# Patient Record
Sex: Male | Born: 1990 | Race: Black or African American | Hispanic: No | Marital: Single | Smoking: Current every day smoker
Health system: Southern US, Community
[De-identification: ages and names within clinical notes are randomized; demographics above are authoritative.]

## PROBLEM LIST (undated history)

## (undated) DIAGNOSIS — J309 Allergic rhinitis, unspecified: Secondary | ICD-10-CM

## (undated) DIAGNOSIS — H6690 Otitis media, unspecified, unspecified ear: Secondary | ICD-10-CM

## (undated) DIAGNOSIS — F419 Anxiety disorder, unspecified: Secondary | ICD-10-CM

## (undated) DIAGNOSIS — S8990XA Unspecified injury of unspecified lower leg, initial encounter: Secondary | ICD-10-CM

## (undated) DIAGNOSIS — L0591 Pilonidal cyst without abscess: Secondary | ICD-10-CM

## (undated) HISTORY — PX: KNEE SURGERY: SHX244

## (undated) HISTORY — DX: Allergic rhinitis, unspecified: J30.9

## (undated) HISTORY — DX: Unspecified injury of unspecified lower leg, initial encounter: S89.90XA

## (undated) HISTORY — DX: Pilonidal cyst without abscess: L05.91

## (undated) HISTORY — PX: WRIST SURGERY: SHX841

## (undated) HISTORY — DX: Otitis media, unspecified, unspecified ear: H66.90

---
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
## 2011-10-16 ENCOUNTER — Emergency Department: Payer: Self-pay | Admitting: Emergency Medicine

## 2012-01-08 ENCOUNTER — Emergency Department: Payer: Self-pay | Admitting: Emergency Medicine

## 2012-02-28 ENCOUNTER — Ambulatory Visit: Payer: Self-pay | Admitting: Family Medicine

## 2013-03-28 ENCOUNTER — Emergency Department: Payer: Self-pay | Admitting: Emergency Medicine

## 2013-08-05 ENCOUNTER — Encounter: Payer: Self-pay | Admitting: *Deleted

## 2013-08-12 ENCOUNTER — Ambulatory Visit: Payer: Self-pay | Admitting: Cardiovascular Disease

## 2013-08-12 ENCOUNTER — Encounter: Payer: Self-pay | Admitting: *Deleted

## 2015-06-23 ENCOUNTER — Emergency Department
Admission: EM | Admit: 2015-06-23 | Discharge: 2015-06-23 | Disposition: A | Discharge status: Home / Self Care | Payer: Self-pay | Attending: Emergency Medicine | Admitting: Emergency Medicine

## 2015-06-23 DIAGNOSIS — H6982 Other specified disorders of Eustachian tube, left ear: Secondary | ICD-10-CM

## 2015-06-23 DIAGNOSIS — J029 Acute pharyngitis, unspecified: Secondary | ICD-10-CM | POA: Insufficient documentation

## 2015-06-23 DIAGNOSIS — Z720 Tobacco use: Secondary | ICD-10-CM | POA: Insufficient documentation

## 2015-06-23 DIAGNOSIS — Z79899 Other long term (current) drug therapy: Secondary | ICD-10-CM | POA: Insufficient documentation

## 2015-06-23 DIAGNOSIS — H6992 Unspecified Eustachian tube disorder, left ear: Secondary | ICD-10-CM | POA: Insufficient documentation

## 2015-06-23 MED ORDER — PREDNISONE 10 MG PO TABS: ORAL_TABLET | ORAL | Status: DC

## 2015-06-23 NOTE — ED Notes (Signed)
NAD noted at time of D/C. Pt denies questions or concerns. Pt ambulatory to the lobby at this time.  

## 2015-06-23 NOTE — ED Notes (Signed)
Pt reports to ED w/ pain in throat and ear.  Pt reports that pain started in ear and has moved down to throat.

## 2015-06-23 NOTE — ED Provider Notes (Signed)
[facility] Emergency Department Provider Note  ____________________________________________  Time seen:  8:37 AM  I have reviewed the triage vital signs and the nursing notes.   HISTORY  Chief Complaint Otalgia and Sore Throat   HPI Tyler Horton is a 24 y.o. male is here with complaint of left ear pain radiating down into his throat. He is unaware of any fever or chills. He states he has had this before and it was "fluid behind his ear". He is able to eat and drink. Pain is constant. He is not taking any over-the-counter medication for his ear. He is unaware of any hearing changes. He denies any dizziness or vertigo. He denies any upper respiratory symptoms. Currently pain is 10 out of 10.   Past Medical History  Diagnosis Date  . Knee injury     left  . Otitis media     left  . Allergic rhinitis   . Pilonidal cyst without infection     There are no active problems to display for this patient.   Past Surgical History  Procedure Laterality Date  . Wrist surgery    . Knee surgery      Current Outpatient Rx  Name  Route  Sig  Dispense  Refill  . buPROPion (WELLBUTRIN SR) 150 MG 12 hr tablet   Oral   Take 150 mg by mouth daily.         . citalopram (CELEXA) 20 MG tablet   Oral   Take 20 mg by mouth daily.         . predniSONE (DELTASONE) 10 MG tablet      Take 3 tablets once a day for 3 days   9 tablet   0     Allergies Review of patient's allergies indicates no known allergies.  No family history on file.  Social History History  Substance Use Topics  . Smoking status: Current Every Day Smoker -- 0.50 packs/day    Types: Cigarettes  . Smokeless tobacco: Not on file  . Alcohol Use: 7.2 oz/week    12 Cans of beer per week    Review of Systems Constitutional: No fever/chills Eyes: No visual changes. ENT: Positive sore throat. Cardiovascular: Denies chest pain. Respiratory: Denies shortness of  breath. Gastrointestinal: No abdominal pain.  No nausea, no vomiting. Genitourinary: Negative for dysuria. Musculoskeletal: Negative for back pain. Skin: Negative for rash. Neurological: Negative for headaches, focal weakness or numbness.  10-point ROS otherwise negative.  ____________________________________________   PHYSICAL EXAM:  VITAL SIGNS: ED Triage Vitals  Enc Vitals Group     BP 06/23/15 0812 112/80 mmHg     Pulse Rate 06/23/15 0809 81     Resp 06/23/15 0809 14     Temp 06/23/15 0809 98.4 F (36.9 C)     Temp Source 06/23/15 0809 Oral     SpO2 06/23/15 0809 99 %     Weight 06/23/15 0809 147 lb (66.679 kg)     Height 06/23/15 0809  (1.778 m)     Head Cir --      Peak Flow --      Pain Score 06/23/15 0810 8     Pain Loc --      Pain Edu? --      Excl. in GC? --     Constitutional: Alert and oriented. Well appearing and in no acute distress. Eyes: Conjunctivae are normal. PERRL. EOMI. Head: Atraumatic. Nose: No congestion/rhinnorhea.  TMs are dull bilaterally with  the left TM poor light reflection. No erythema Mouth/Throat: Mucous membranes are moist.  Oropharynx non-erythematous. Neck: No stridor.  Supple Hematological/Lymphatic/Immunilogical: No cervical lymphadenopathy. Cardiovascular: Normal rate, regular rhythm. Grossly normal heart sounds.  Good peripheral circulation. Respiratory: Normal respiratory effort.  No retractions. Lungs CTAB. Gastrointestinal: Soft and nontender. No distention. Musculoskeletal: No lower extremity tenderness nor edema.  No joint effusions. Neurologic:  Normal speech and language. No gross focal neurologic deficits are appreciated. No gait instability. Skin:  Skin is warm, dry and intact. No rash noted. Psychiatric: Mood and affect are normal. Speech and behavior are normal.  ____________________________________________   LABS (all labs ordered are listed, but only abnormal results are displayed)  Labs Reviewed - No  data to display  PROCEDURES  Procedure(s) performed: None  Critical Care performed: No  ____________________________________________   INITIAL IMPRESSION / ASSESSMENT AND PLAN / ED COURSE  Pertinent labs & imaging results that were available during my care of the patient were reviewed by me and considered in my medical decision making (see chart for details).  Patient was started on prednisone for eustachian tube dysfunction. He is follow-up with Dr. Willeen Cass if any continued problems with his ear. ____________________________________________   FINAL CLINICAL IMPRESSION(S) / ED DIAGNOSES  Final diagnoses:  Eustachian tube dysfunction, left      Tommi Rumps, PA-C 06/23/15 1191  Arnaldo Natal, MD 06/23/15 1723

## 2016-04-10 ENCOUNTER — Emergency Department
Admission: EM | Admit: 2016-04-10 | Discharge: 2016-04-10 | Disposition: A | Discharge status: Home / Self Care | Payer: Self-pay | Attending: Emergency Medicine | Admitting: Emergency Medicine

## 2016-04-10 ENCOUNTER — Encounter: Payer: Self-pay | Admitting: Emergency Medicine

## 2016-04-10 ENCOUNTER — Emergency Department: Payer: Self-pay

## 2016-04-10 DIAGNOSIS — Y929 Unspecified place or not applicable: Secondary | ICD-10-CM | POA: Insufficient documentation

## 2016-04-10 DIAGNOSIS — F17210 Nicotine dependence, cigarettes, uncomplicated: Secondary | ICD-10-CM | POA: Insufficient documentation

## 2016-04-10 DIAGNOSIS — X500XXA Overexertion from strenuous movement or load, initial encounter: Secondary | ICD-10-CM | POA: Insufficient documentation

## 2016-04-10 DIAGNOSIS — Y9389 Activity, other specified: Secondary | ICD-10-CM | POA: Insufficient documentation

## 2016-04-10 DIAGNOSIS — S29011A Strain of muscle and tendon of front wall of thorax, initial encounter: Secondary | ICD-10-CM | POA: Insufficient documentation

## 2016-04-10 DIAGNOSIS — Y999 Unspecified external cause status: Secondary | ICD-10-CM | POA: Insufficient documentation

## 2016-04-10 MED ORDER — BACLOFEN 10 MG PO TABS: 10.0000 mg | ORAL_TABLET | Freq: Three times a day (TID) | ORAL | Status: DC

## 2016-04-10 MED ORDER — IBUPROFEN 800 MG PO TABS: 800.0000 mg | ORAL_TABLET | Freq: Three times a day (TID) | ORAL | Status: DC | PRN

## 2016-04-10 NOTE — ED Notes (Signed)
Pt presents with chest bone pain started yesterday, feels muscular in type. Pt lifts heavy boxes at work.

## 2016-04-10 NOTE — Discharge Instructions (Signed)

## 2016-04-10 NOTE — ED Provider Notes (Signed)
[facility] Emergency Department Provider Note  ____________________________________________  Time seen: Approximately 2:56 PM  I have reviewed the triage vital signs and the nursing notes.   HISTORY  Chief Complaint Muscle Pain    HPI Tyler Horton is a 25 y.o. male who presents for evaluation of chest wall pain. Patient states that his sternal bone hurts. States that he thinks may be due to lifting boxes at work but unsure. Denies any shortness of breath. Denies any nausea vomiting or diaphoresis.   Past Medical History  Diagnosis Date  . Knee injury     left  . Otitis media     left  . Allergic rhinitis   . Pilonidal cyst without infection     There are no active problems to display for this patient.   Past Surgical History  Procedure Laterality Date  . Wrist surgery    . Knee surgery      Current Outpatient Rx  Name  Route  Sig  Dispense  Refill  . baclofen (LIORESAL) 10 MG tablet   Oral   Take 1 tablet (10 mg total) by mouth 3 (three) times daily.   30 tablet   0   . ibuprofen (ADVIL,MOTRIN) 800 MG tablet   Oral   Take 1 tablet (800 mg total) by mouth every 8 (eight) hours as needed.   30 tablet   0     Allergies Review of patient's allergies indicates no known allergies.  No family history on file.  Social History Social History  Substance Use Topics  . Smoking status: Current Every Day Smoker -- 0.50 packs/day    Types: Cigarettes  . Smokeless tobacco: None  . Alcohol Use: 7.2 oz/week    12 Cans of beer per week    Review of Systems Constitutional: No fever/chills Eyes: No visual changes. ENT: No sore throat. Cardiovascular:Positive for chest wall pain. Respiratory: Denies shortness of breath. Gastrointestinal: No abdominal pain.  No nausea, no vomiting.  No diarrhea.  No constipation. Musculoskeletal:Positive for chest wall pain. Skin: Negative for rash. Neurological: Negative for headaches, focal weakness  or numbness.  10-point ROS otherwise negative.  ____________________________________________   PHYSICAL EXAM:  VITAL SIGNS: ED Triage Vitals  Enc Vitals Group     BP 04/10/16 1349 107/55 mmHg     Pulse Rate 04/10/16 1349 82     Resp 04/10/16 1349 18     Temp 04/10/16 1349 98.2 F (36.8 C)     Temp Source 04/10/16 1349 Oral     SpO2 04/10/16 1349 100 %     Weight 04/10/16 1349 151 lb (68.493 kg)     Height 04/10/16 1349  (1.753 m)     Head Cir --      Peak Flow --      Pain Score 04/10/16 1349 7     Pain Loc --      Pain Edu? --      Excl. in GC? --     Constitutional: Alert and oriented. Well appearing and in no acute distress.  Cardiovascular: Normal rate, regular rhythm. Grossly normal heart sounds.  Good peripheral circulation. Respiratory: Normal respiratory effort.  No retractions. Lungs CTAB. Gastrointestinal: Soft and nontender. No distention. No abdominal bruits. No CVA tenderness. Musculoskeletal: Point tenderness to the chest wall midsternal. Neurologic:  Normal speech and language. No gross focal neurologic deficits are appreciated. No gait instability. Skin:  Skin is warm, dry and intact. No rash noted. Psychiatric: Mood and  affect are normal. Speech and behavior are normal.  ____________________________________________   LABS (all labs ordered are listed, but only abnormal results are displayed)  Labs Reviewed - No data to display ____________________________________________  EKG  No acute STEMI. Normal sinus rhythm. ____________________________________________  RADIOLOGY  No acute cardiopulmonary findings. No acute osseous findings. ____________________________________________   PROCEDURES  Procedure(s) performed: None  Critical Care performed: No  ____________________________________________   INITIAL IMPRESSION / ASSESSMENT AND PLAN / ED COURSE  Pertinent labs & imaging results that were available during my care of the patient  were reviewed by me and considered in my medical decision making (see chart for details).  Nonspecific chest wall pain. Rx given for baclofen 10 mg 3 times a day and Motrin 800 mg 3 times a day. Work excuse 24 hours given. Patient to follow-up with PCP or return to ER with any worsening symptomology. ____________________________________________   FINAL CLINICAL IMPRESSION(S) / ED DIAGNOSES  Final diagnoses:  Chest wall muscle strain, initial encounter     This chart was dictated using voice recognition software/Dragon. Despite best efforts to proofread, errors can occur which can change the meaning. Any change was purely unintentional.   Evangeline Dakinharles M Kathrynne Kulinski, PA-C 04/10/16 1535  Governor Rooksebecca Lord, MD 04/10/16 [phone]

## 2016-06-21 ENCOUNTER — Emergency Department
Admission: EM | Admit: 2016-06-21 | Discharge: 2016-06-21 | Disposition: A | Discharge status: Home / Self Care | Payer: Self-pay | Attending: Student in an Organized Health Care Education/Training Program | Admitting: Student in an Organized Health Care Education/Training Program

## 2016-06-21 ENCOUNTER — Encounter: Payer: Self-pay | Admitting: *Deleted

## 2016-06-21 DIAGNOSIS — J039 Acute tonsillitis, unspecified: Secondary | ICD-10-CM | POA: Insufficient documentation

## 2016-06-21 DIAGNOSIS — F17210 Nicotine dependence, cigarettes, uncomplicated: Secondary | ICD-10-CM | POA: Insufficient documentation

## 2016-06-21 LAB — POCT RAPID STREP A: STREPTOCOCCUS, GROUP A SCREEN (DIRECT): NEGATIVE

## 2016-06-21 MED ORDER — DEXAMETHASONE 2 MG PO TABS: ORAL_TABLET | ORAL | 0 refills | Status: DC

## 2016-06-21 MED ORDER — FLUCONAZOLE 100 MG PO TABS: 100.0000 mg | ORAL_TABLET | Freq: Every day | ORAL | 0 refills | Status: DC

## 2016-06-21 MED ORDER — ACETAMINOPHEN 325 MG PO TABS
650.0000 mg | ORAL_TABLET | Freq: Once | ORAL | Status: AC
  Administered 2016-06-21: 650 mg via ORAL
  Filled 2016-06-21: qty 2

## 2016-06-21 MED ORDER — MAGIC MOUTHWASH W/LIDOCAINE: 5.0000 mL | Freq: Four times a day (QID) | ORAL | 0 refills | Status: DC | PRN

## 2016-06-21 MED ORDER — AMOXICILLIN 875 MG PO TABS: 875.0000 mg | ORAL_TABLET | Freq: Two times a day (BID) | ORAL | 0 refills | Status: DC

## 2016-06-21 NOTE — ED Notes (Signed)
Sore throat since Monday  Positive fever  Increased pain with swallowing

## 2016-06-21 NOTE — ED Triage Notes (Signed)
Pt arrives with complaints of a sore throat since Monday

## 2016-06-21 NOTE — ED Provider Notes (Signed)
[facility] Emergency Department Provider Note  ____________________________________________  Time seen: Approximately 12:58 PM  I have reviewed the triage vital signs and the nursing notes.   HISTORY  Chief Complaint Sore Throat    HPI Tyler Horton is a 25 y.o. male , NAD, presents to the emergency department with 2 day history of sore throat, fevers and body aches. Patient states sore throat began on Monday and has been consistently worsening. Has felt like he's run a fever but has not checked his temperature and has had body aches with fatigue. Has been able to eat and drink but with pain with swallowing. Has not had any difficulty breathing. Denies chest pain, shortness of breath. Has not had any nasal congestion, sinus pressure, ear pain. Denies abdominal pain, nausea, vomiting nor rashes. Denies any sick contacts. Has not been taking anything over-the-counter for his current symptoms.   Past Medical History:  Diagnosis Date  . Allergic rhinitis   . Knee injury    left  . Otitis media    left  . Pilonidal cyst without infection     There are no active problems to display for this patient.   Past Surgical History:  Procedure Laterality Date  . KNEE SURGERY    . WRIST SURGERY      Prior to Admission medications   Medication Sig Start Date End Date Taking? Authorizing Provider  amoxicillin (AMOXIL) 875 MG tablet Take 1 tablet (875 mg total) by mouth 2 (two) times daily. 06/21/16   Abbigale Mcelhaney L Lucynda Rosano, PA-C  dexamethasone (DECADRON) 2 MG tablet Take 6 tablets on Day 1 with food, then decrease by 1 tablet daily until finished (6,5,4,3,2,1) 06/21/16   Azzure Garabedian L Kriya Westra, PA-C  fluconazole (DIFLUCAN) 100 MG tablet Take 1 tablet (100 mg total) by mouth daily. Take 1 tablet by mouth today and another in 3 days 06/21/16   Celestia Duva L Ania Levay, PA-C  magic mouthwash w/lidocaine SOLN Take 5 mLs by mouth 4 (four) times daily as needed for mouth pain. 06/21/16   Shade Rivenbark L Cullen Lahaie, PA-C     Allergies Review of patient's allergies indicates no known allergies.  History reviewed. No pertinent family history.  Social History Social History  Substance Use Topics  . Smoking status: Current Every Day Smoker    Packs/day: 0.50    Types: Cigarettes  . Smokeless tobacco: Not on file  . Alcohol use 7.2 oz/week    12 Cans of beer per week     Review of Systems  Constitutional: Positive fever, chills, fatigue Eyes: No visual changes. No discharge, redness, pain ENT: Positive sore throat. No nasal congestion, runny nose, sinus pressure, ear pain. Cardiovascular: No chest pain. Respiratory: No cough. No shortness of breath. No wheezing.  Gastrointestinal: No abdominal pain.  No nausea, vomiting.  No diarrhea.  Musculoskeletal: Positive for general myalgias.  Skin: Negative for rash. Neurological: Negative for headaches, focal weakness or numbness. No tingling. 10-point ROS otherwise negative.  ____________________________________________   PHYSICAL EXAM:  VITAL SIGNS: ED Triage Vitals [06/21/16 1245]  Enc Vitals Group     BP 106/68     Pulse Rate (!) 108     Resp 18     Temp (!) 101.1 F (38.4 C)     Temp Source Oral     SpO2 98 %     Weight 159 lb (72.1 kg)     Height  (1.778 m)     Head Circumference      Peak  Flow      Pain Score 9     Pain Loc      Pain Edu?      Excl. in GC?      Constitutional: Alert and oriented. Well appearing and in no acute distress. Eyes: Conjunctivae are normal.  Head: Atraumatic. ENT:      Ears: TMs visualized bilaterally without erythema, effusion, bulging nor perforation.      Nose: No congestion/rhinnorhea.      Mouth/Throat: Bilateral tonsils with moderate swelling and injection with white exudate. Uvula is midline but swollen and covered with white exudate. Upper palate with white exudate and underlying erythema. No posterior pharyngeal swelling. Patient is able to swallow secretions. Mucous membranes are  moist.  Neck: No stridor. Supple with full range of motion. Trachea is midline. Hematological/Lymphatic/Immunilogical: Positive left, anterior, focal cervical lymphadenopathy with mild tenderness to palpation but is mobile. Cardiovascular: Normal rate, regular rhythm. Normal S1 and S2.  Good peripheral circulation. Respiratory: Normal respiratory effort without tachypnea or retractions. Lungs CTAB with breath sounds noted in all lung fields. Neurologic:  Normal speech and language. No gross focal neurologic deficits are appreciated.  Skin:  Skin is warm, dry and intact. No rash noted. Psychiatric: Mood and affect are normal. Speech and behavior are normal. Patient exhibits appropriate insight and judgement.   ____________________________________________   LABS (all labs ordered are listed, but only abnormal results are displayed)  Labs Reviewed  CULTURE, GROUP A STREP [facility])  POCT RAPID STREP A    ----------------------------------------- 1:41 PM on 06/21/2016 -----------------------------------------  I called and spoke with a representative in the [facility] lab to states that if he group A strep culture grows yeast that we will be notified of that result. No separate throat culture will need to be done to assess for yeast. ____________________________________________  EKG  None ____________________________________________  RADIOLOGY  None ____________________________________________    PROCEDURES  Procedure(s) performed: None   Procedures   Medications  acetaminophen (TYLENOL) tablet 650 mg (650 mg Oral Given 06/21/16 1325)     ____________________________________________   INITIAL IMPRESSION / ASSESSMENT AND PLAN / ED COURSE  Pertinent labs & imaging results that were available during my care of the patient were reviewed by me and considered in my medical decision making (see chart for details).  Clinical Course    Patient's  diagnosis is consistent with Acute tonsillitis. Patient will be discharged home with prescriptions for amoxicillin, Decadron, Diflucan and Magic mouthwash with lidocaine use as directed. Patient will be notified of for culture results when available. Patient is to follow up with his primary care provider in 48 hours for recheck. Patient is given strict ED precautions to return to the ED for any worsening or new symptoms.      ____________________________________________  FINAL CLINICAL IMPRESSION(S) / ED DIAGNOSES  Final diagnoses:  Tonsillitis      NEW MEDICATIONS STARTED DURING THIS VISIT:  Discharge Medication List as of 06/21/2016  1:50 PM    START taking these medications   Details  amoxicillin (AMOXIL) 875 MG tablet Take 1 tablet (875 mg total) by mouth 2 (two) times daily., Starting Wed 06/21/2016, Print    dexamethasone (DECADRON) 2 MG tablet Take 6 tablets on Day 1 with food, then decrease by 1 tablet daily until finished (6,5,4,3,2,1), Print    fluconazole (DIFLUCAN) 100 MG tablet Take 1 tablet (100 mg total) by mouth daily. Take 1 tablet by mouth today and another in 3 days, Starting Wed 06/21/2016,  Print    magic mouthwash w/lidocaine SOLN Take 5 mLs by mouth 4 (four) times daily as needed for mouth pain., Starting Wed 06/21/2016, Print             Ernestene Kiel [place], PA-C 06/21/16 1433    Willy Eddy, MD 06/21/16 1440

## 2016-06-24 LAB — CULTURE, GROUP A STREP (THRC)

## 2016-08-07 ENCOUNTER — Emergency Department
Admission: EM | Admit: 2016-08-07 | Discharge: 2016-08-07 | Disposition: A | Discharge status: Home / Self Care | Payer: Self-pay | Attending: Emergency Medicine | Admitting: Emergency Medicine

## 2016-08-07 ENCOUNTER — Encounter: Payer: Self-pay | Admitting: Emergency Medicine

## 2016-08-07 DIAGNOSIS — F17210 Nicotine dependence, cigarettes, uncomplicated: Secondary | ICD-10-CM | POA: Insufficient documentation

## 2016-08-07 DIAGNOSIS — J039 Acute tonsillitis, unspecified: Secondary | ICD-10-CM | POA: Insufficient documentation

## 2016-08-07 MED ORDER — SULFAMETHOXAZOLE-TRIMETHOPRIM 800-160 MG PO TABS: 1.0000 | ORAL_TABLET | Freq: Two times a day (BID) | ORAL | 0 refills | Status: DC

## 2016-08-07 MED ORDER — LIDOCAINE VISCOUS 2 % MT SOLN: 20.0000 mL | OROMUCOSAL | 0 refills | Status: DC | PRN

## 2016-08-07 NOTE — Discharge Instructions (Signed)
You need to follow-up with ENT for your chronic recurrent tonsillitis.

## 2016-08-07 NOTE — ED Triage Notes (Signed)
Pt to ed with c/o sore throat and earache x 2 days.

## 2016-08-07 NOTE — ED Provider Notes (Signed)
[facility] Emergency Department Provider Note  ____________________________________________  Time seen: Approximately 2:30 PM  I have reviewed the triage vital signs and the nursing notes.   HISTORY  Chief Complaint Sore Throat and Otalgia    HPI Tyler Horton is a 25 y.o. male sore throat and pressure to his right ear 2 days. Past medical history significant for exudative tonsillitis has not followed up with ENT. States low-grade fever as noted in vital signs.   Past Medical History:  Diagnosis Date  . Allergic rhinitis   . Knee injury    left  . Otitis media    left  . Pilonidal cyst without infection     There are no active problems to display for this patient.   Past Surgical History:  Procedure Laterality Date  . KNEE SURGERY    . WRIST SURGERY      Prior to Admission medications   Medication Sig Start Date End Date Taking? Authorizing Provider  lidocaine (XYLOCAINE) 2 % solution Use as directed 20 mLs in the mouth or throat as needed for mouth pain. 08/07/16   Charmayne Sheerharles M Sherilee Smotherman, PA-C  sulfamethoxazole-trimethoprim (BACTRIM DS,SEPTRA DS) 800-160 MG tablet Take 1 tablet by mouth 2 (two) times daily. 08/07/16   Evangeline Dakinharles M Gari Trovato, PA-C    Allergies Review of patient's allergies indicates no known allergies.  History reviewed. No pertinent family history.  Social History Social History  Substance Use Topics  . Smoking status: Current Every Day Smoker    Packs/day: 0.50    Types: Cigarettes  . Smokeless tobacco: Never Used  . Alcohol use 7.2 oz/week    12 Cans of beer per week    Review of Systems Constitutional: No fever/chills Eyes: No visual changes. ENT: Positive sore throat. Cardiovascular: Denies chest pain. Respiratory: Denies shortness of breath. Musculoskeletal: Negative for back pain. Skin: Negative for rash. Neurological: Negative for headaches, focal weakness or numbness.  10-point ROS otherwise  negative.  ____________________________________________   PHYSICAL EXAM:  VITAL SIGNS: ED Triage Vitals   Enc Vitals Group     BP (!) 121/104     Pulse Rate (!) 118     Resp 20     Temp 100.2 F (37.9 C)     Temp Source Oral     SpO2 100 %     Weight 159 lb (72.1 kg)     Height 5\' 10"  (1.778 m)     Head Circumference      Peak Flow      Pain Score 9     Pain Loc      Pain Edu?      Excl. in GC?     Constitutional: Alert and oriented. Well appearing and in no acute distress. Head: Atraumatic. Nose: No congestion/rhinnorhea. Mouth/Throat: Mucous membranes are moist.  Oropharynx Is extremely erythematous with 2+ tonsillar exudate with edema. Neck: No stridor.  Supple for the motion positive edema noted to the right anterior aspect of his ear and neck. Cardiovascular: Normal rate, regular rhythm. Grossly normal heart sounds.  Good peripheral circulation. Respiratory: Normal respiratory effort.  No retractions. Lungs CTAB. Musculoskeletal: No lower extremity tenderness nor edema.  No joint effusions. Neurologic:  Normal speech and language. No gross focal neurologic deficits are appreciated. No gait instability. Skin:  Skin is warm, dry and intact. No rash noted. Psychiatric: Mood and affect are normal. Speech and behavior are normal.  ____________________________________________   LABS (all labs ordered are listed, but only abnormal results  are displayed)  Labs Reviewed - No data to display ____________________________________________  EKG   ____________________________________________  RADIOLOGY   ____________________________________________   PROCEDURES  Procedure(s) performed: None  Critical Care performed: No  ____________________________________________   INITIAL IMPRESSION / ASSESSMENT AND PLAN / ED COURSE  Pertinent labs & imaging results that were available during my care of the patient were reviewed by me and considered in my medical decision  making (see chart for details).  Review of the [place] CSRS was performed in accordance of the [facility] prior to dispensing any controlled drugs.  Rx Bactrim DS twice a day a viscous lidocaine as needed. Patient follow-up with ENT as scheduled.  Clinical Course    ____________________________________________   FINAL CLINICAL IMPRESSION(S) / ED DIAGNOSES  Final diagnoses:  Tonsillitis with exudate     This chart was dictated using voice recognition software/Dragon. Despite best efforts to proofread, errors can occur which can change the meaning. Any change was purely unintentional.    Evangeline Dakin, PA-C 08/07/16 1542    Minna Antis, MD 08/08/16 [id number]

## 2017-03-19 ENCOUNTER — Encounter: Payer: Self-pay | Admitting: Emergency Medicine

## 2017-03-19 ENCOUNTER — Emergency Department
Admission: EM | Admit: 2017-03-19 | Discharge: 2017-03-19 | Disposition: A | Discharge status: Home / Self Care | Payer: Self-pay | Attending: Emergency Medicine | Admitting: Emergency Medicine

## 2017-03-19 DIAGNOSIS — L0591 Pilonidal cyst without abscess: Secondary | ICD-10-CM

## 2017-03-19 DIAGNOSIS — L0501 Pilonidal cyst with abscess: Secondary | ICD-10-CM | POA: Insufficient documentation

## 2017-03-19 DIAGNOSIS — F17210 Nicotine dependence, cigarettes, uncomplicated: Secondary | ICD-10-CM | POA: Insufficient documentation

## 2017-03-19 MED ORDER — SULFAMETHOXAZOLE-TRIMETHOPRIM 800-160 MG PO TABS
ORAL_TABLET | ORAL | Status: AC
  Administered 2017-03-19: 1 via ORAL
  Filled 2017-03-19: qty 1

## 2017-03-19 MED ORDER — SULFAMETHOXAZOLE-TRIMETHOPRIM 800-160 MG PO TABS
ORAL_TABLET | ORAL | Status: AC
  Filled 2017-03-19: qty 1

## 2017-03-19 MED ORDER — TRAMADOL HCL 50 MG PO TABS: 50.0000 mg | ORAL_TABLET | Freq: Three times a day (TID) | ORAL | 0 refills | Status: DC | PRN

## 2017-03-19 MED ORDER — SULFAMETHOXAZOLE-TRIMETHOPRIM 800-160 MG PO TABS
1.0000 | ORAL_TABLET | Freq: Once | ORAL | Status: AC
  Administered 2017-03-19: 1 via ORAL

## 2017-03-19 MED ORDER — SULFAMETHOXAZOLE-TRIMETHOPRIM 800-160 MG PO TABS: 1.0000 | ORAL_TABLET | Freq: Two times a day (BID) | ORAL | 0 refills | Status: DC

## 2017-03-19 NOTE — ED Provider Notes (Signed)
[facility] Emergency Department Provider Note ____________________________________________  Time seen: 72  I have reviewed the triage vital signs and the nursing notes.  HISTORY  Chief Complaint  Abscess  HPI Tyler Horton is a 26 y.o. male Presents to the ED for evaluation of a possible early pilonidal cyst abscess to the buttocks. Patient describes small, hard complaint. This been present for the last 4 days. He reports a very small amount of purulent drainage after he placed a piece of rolled up toilet tissue In the gluteal cleft at onset. He denies any spontaneous drainage, bleeding, or purulent since then. He denies any fevers, chills, sweats.  Past Medical History:  Diagnosis Date  . Allergic rhinitis   . Knee injury    left  . Otitis media    left  . Pilonidal cyst without infection     There are no active problems to display for this patient.   Past Surgical History:  Procedure Laterality Date  . KNEE SURGERY    . WRIST SURGERY      Prior to Admission medications   Medication Sig Start Date End Date Taking? Authorizing Provider  lidocaine (XYLOCAINE) 2 % solution Use as directed 20 mLs in the mouth or throat as needed for mouth pain. 08/07/16   Charmayne Sheer Beers, PA-C  sulfamethoxazole-trimethoprim (BACTRIM DS,SEPTRA DS) 800-160 MG tablet Take 1 tablet by mouth 2 (two) times daily. 03/19/17   Cameran Pettey V Bacon Aviyon Hocevar, PA-C  traMADol (ULTRAM) 50 MG tablet Take 1 tablet (50 mg total) by mouth 3 (three) times daily as needed. 03/19/17   Charlesetta Ivory Kierstynn Babich, PA-C    Allergies Patient has no known allergies.  No family history on file.  Social History Social History  Substance Use Topics  . Smoking status: Current Every Day Smoker    Packs/day: 0.50    Types: Cigarettes  . Smokeless tobacco: Never Used  . Alcohol use 7.2 oz/week    12 Cans of beer per week    Review of Systems  Constitutional: Negative for fever. Cardiovascular:  Negative for chest pain. Respiratory: Negative for shortness of breath. Gastrointestinal: Negative for abdominal pain, vomiting and diarrhea. Genitourinary: Negative for dysuria. Musculoskeletal: Negative for back pain. Skin: Negative for rash. Buttocks abscess as above. Neurological: Negative for headaches, focal weakness or numbness. ____________________________________________  PHYSICAL EXAM:  VITAL SIGNS: ED Triage Vitals  Enc Vitals Group     BP 03/19/17 1750 (!) 149/94     Pulse Rate 03/19/17 1750 95     Resp 03/19/17 1750 16     Temp 03/19/17 1750 98.7 F (37.1 C)     Temp Source 03/19/17 1750 Oral     SpO2 03/19/17 1750 100 %     Weight 03/19/17 1749 150 lb (68 kg)     Height 03/19/17 1749  (1.753 m)     Head Circumference --      Peak Flow --      Pain Score 03/19/17 1749 8     Pain Loc --      Pain Edu? --      Excl. in GC? --     Constitutional: Alert and oriented. Well appearing and in no distress. Head: Normocephalic and atraumatic. Cardiovascular: Normal rate, regular rhythm. Normal distal pulses. Respiratory: Normal respiratory effort. No wheezes/rales/rhonchi. Gastrointestinal: Soft and nontender. No distention. Neurologic:  Normal gait without ataxia. Normal speech and language. No gross focal neurologic deficits are appreciated. Skin:  Skin is warm, dry  and intact. No rash noted. Patient with some mild tenderness. There are 3-4 distinct pinpoint dimpling consistent with pilonidal cyst sinus tracts. No spontaneous purulent drainage is expressed from the area. No significant redness, warmth, induration, or fluctuance is appreciated. ____________________________________________  PROCEDURES  Bactrim DS i PO Ultram 50 mg PO ____________________________________________  INITIAL IMPRESSION / ASSESSMENT AND PLAN / ED COURSE  Patient with an early presentation of an pilonidal cyst abscess. There is no appreciable fluctuance, pointing, or spontaneous  drainage to indicate a frank abscess formation at this time. Patient will be started on Bactrim empirically for abscess prophylaxis. He is also advised to continue to apply warm compresses to the area to promote development. He should return to the ED immediately for any spontaneous pain, redness, swelling, or spontaneous drainage. He should follow-up with his primary care provider for ongoing symptom management. ____________________________________________  FINAL CLINICAL IMPRESSION(S) / ED DIAGNOSES  Final diagnoses:  Pilonidal abscess  Pilonidal cyst      Lissa Hoard, PA-C 03/19/17 1940    Merrily Brittle, MD 03/19/17 1941

## 2017-03-19 NOTE — ED Notes (Signed)
Small hard painful 8/10 area buttocks.

## 2017-03-19 NOTE — Discharge Instructions (Signed)
You have a pilonidal cyst with early abscess formation, likely. There is no clear area of pus collection to drain on presentation today. Take the antibiotic as directed. Apply warm compresses to promote development and healing. Return to the ED for any pointing, swelling, or drainage.

## 2018-02-25 ENCOUNTER — Emergency Department
Admission: EM | Admit: 2018-02-25 | Discharge: 2018-02-25 | Disposition: A | Discharge status: Home / Self Care | Payer: Self-pay | Attending: Emergency Medicine | Admitting: Emergency Medicine

## 2018-02-25 ENCOUNTER — Encounter: Payer: Self-pay | Admitting: Emergency Medicine

## 2018-02-25 ENCOUNTER — Other Ambulatory Visit: Payer: Self-pay

## 2018-02-25 DIAGNOSIS — L02411 Cutaneous abscess of right axilla: Secondary | ICD-10-CM | POA: Insufficient documentation

## 2018-02-25 DIAGNOSIS — F17210 Nicotine dependence, cigarettes, uncomplicated: Secondary | ICD-10-CM | POA: Insufficient documentation

## 2018-02-25 MED ORDER — HYDROCODONE-ACETAMINOPHEN 5-325 MG PO TABS: 1.0000 | ORAL_TABLET | Freq: Four times a day (QID) | ORAL | 0 refills | Status: DC | PRN

## 2018-02-25 MED ORDER — LIDOCAINE HCL (PF) 1 % IJ SOLN: 5.0000 mL | Freq: Once | INTRAMUSCULAR | Status: DC

## 2018-02-25 MED ORDER — LIDOCAINE HCL (PF) 1 % IJ SOLN
INTRAMUSCULAR | Status: AC
  Filled 2018-02-25: qty 5

## 2018-02-25 MED ORDER — SULFAMETHOXAZOLE-TRIMETHOPRIM 800-160 MG PO TABS: 1.0000 | ORAL_TABLET | Freq: Two times a day (BID) | ORAL | 0 refills | Status: DC

## 2018-02-25 NOTE — ED Provider Notes (Signed)
[facility] Emergency Department Provider Note   ____________________________________________   First MD Initiated Contact with Patient 02/25/18 1243     (approximate)  I have reviewed the triage vital signs and the nursing notes.   HISTORY  Chief Complaint Abscess  HPI Tyler Horton is a 27 y.o. male presents to the emergency department today with complaint of abscess to his right axilla and also an early abscess to his buttocks.  Patient states that he has had a pilonidal cyst in the past.  He denies any fever or chills.  He has not taken any over-the-counter medication for this.  He rates his pain as 7 out of 10.  Past Medical History:  Diagnosis Date  . Allergic rhinitis   . Knee injury    left  . Otitis media    left  . Pilonidal cyst without infection     There are no active problems to display for this patient.   Past Surgical History:  Procedure Laterality Date  . KNEE SURGERY    . WRIST SURGERY      Prior to Admission medications   Medication Sig Start Date End Date Taking? Authorizing Provider  HYDROcodone-acetaminophen (NORCO/VICODIN) 5-325 MG tablet Take 1 tablet by mouth every 6 (six) hours as needed for moderate pain. 02/25/18   Tommi Rumps, PA-C  sulfamethoxazole-trimethoprim (BACTRIM DS,SEPTRA DS) 800-160 MG tablet Take 1 tablet by mouth 2 (two) times daily. 02/25/18   Tommi Rumps, PA-C    Allergies Patient has no known allergies.  History reviewed. No pertinent family history.  Social History Social History   Tobacco Use  . Smoking status: Current Every Day Smoker    Packs/day: 0.50    Types: Cigarettes  . Smokeless tobacco: Never Used  Substance Use Topics  . Alcohol use: Yes    Alcohol/week: 7.2 oz    Types: 12 Cans of beer per week  . Drug use: No    Types: Marijuana    Comment: past    Review of Systems Constitutional: No fever/chills Cardiovascular: Denies chest pain. Respiratory: Denies  shortness of breath. Gastrointestinal:  No nausea, no vomiting.  Musculoskeletal: Negative for back pain. Skin: Positive for abscess. Neurological: Negative for headaches, focal weakness or numbness. ____________________________________________   PHYSICAL EXAM:  VITAL SIGNS: ED Triage Vitals  Enc Vitals Group     BP 02/25/18 1205 101/67     Pulse Rate 02/25/18 1205 83     Resp 02/25/18 1205 20     Temp 02/25/18 1205 98.6 F (37 C)     Temp Source 02/25/18 1205 Oral     SpO2 02/25/18 1205 98 %     Weight 02/25/18 1202 145 lb (65.8 kg)     Height 02/25/18 1202 5\' 9"  (1.753 m)     Head Circumference --      Peak Flow --      Pain Score 02/25/18 1202 7     Pain Loc --      Pain Edu? --      Excl. in GC? --    Constitutional: Alert and oriented. Well appearing and in no acute distress. Eyes: Conjunctivae are normal.  Head: Atraumatic. Neck: No stridor.   Cardiovascular: Normal rate, regular rhythm. Grossly normal heart sounds.  Good peripheral circulation. Respiratory: Normal respiratory effort.  No retractions. Lungs CTAB. Musculoskeletal: Moves upper and lower extremities without any difficulty.  Normal gait was noted. Neurologic:  Normal speech and language. No gross focal neurologic  deficits are appreciated.  Skin:  Skin is warm, dry.  There is an approximate 2-1/2-3 cm abscess noted in the right axilla.  Minimal erythema and no extending cellulitis is present.  Area was tender to touch.  On examination of the pilonidal and buttocks there is no abscess present however there is some minimal skin tenderness.  No erythema was noted. Psychiatric: Mood and affect are normal. Speech and behavior are normal.  ____________________________________________   LABS (all labs ordered are listed, but only abnormal results are displayed)  Labs Reviewed - No data to display   Procedure  INCISION AND DRAINAGE Performed by: Tommi Rumpshonda L Gabriel Conry Consent: Verbal consent obtained. Risks and  benefits: risks, benefits and alternatives were discussed Type: abscess  Body area: Right axilla  Anesthesia: local infiltration  Incision was made with a scalpel.  Local anesthetic: lidocaine 1 % without epinephrine  Anesthetic total: 2 ml  Complexity: complex Blunt dissection to break up loculations  Drainage: purulent  Drainage amount: Small  Packing material: 1/4 in iodoform gauze  Patient tolerance: Patient tolerated the procedure well with no immediate complications.     PROCEDURES  Critical Care performed: No  ____________________________________________   INITIAL IMPRESSION / ASSESSMENT AND PLAN / ED COURSE  As part of my medical decision making, I reviewed the following data within the electronic MEDICAL RECORD NUMBER Notes from prior ED visits and [place] Controlled Substance Database  I&D of abscess was tolerated well by patient.  Patient is aware that should the drain not fall out in the next 2 days he is to follow-up with his PCP or return to the emergency department.  Patient was started on Bactrim DS twice daily for 10 days and Norco as needed for pain.  Patient was not given any oral medication in the emergency department as he was driving.  ____________________________________________   FINAL CLINICAL IMPRESSION(S) / ED DIAGNOSES  Final diagnoses:  Abscess of axilla, right     ED Discharge Orders        Ordered    sulfamethoxazole-trimethoprim (BACTRIM DS,SEPTRA DS) 800-160 MG tablet  2 times daily     02/25/18 1333    HYDROcodone-acetaminophen (NORCO/VICODIN) 5-325 MG tablet  Every 6 hours PRN     02/25/18 1333       Note:  This document was prepared using Dragon voice recognition software and may include unintentional dictation errors.    Tommi RumpsSummers, Cristen Murcia L, PA-C 02/25/18 1512    Jene EveryKinner, Robert, MD 02/25/18 [phone]

## 2018-02-25 NOTE — ED Notes (Signed)
Sling order placed in error by Bjorn Loserhonda, PA.  Sling was not placed or given to patient.

## 2018-02-25 NOTE — ED Notes (Signed)
See triage note states he developed a possible abscess area under right arm several days ago  Also has a small area to buttocks

## 2018-02-25 NOTE — Discharge Instructions (Addendum)
Follow-up with your primary care provider at [facility] if any continued problems.  Begin taking Bactrim DS twice daily for 10 days.  Norco as needed for pain only as directed and do not drive while taking this medication.  If your drain has not fallen out in the next 2 days either follow-up with your primary care or return to the emergency department for removal.

## 2018-02-25 NOTE — ED Triage Notes (Signed)
Here for right axilla abscess and right buttocks abscess.  Hx of same and normally needs drained and go home with abx.  No history of surgery.  Small abscess to right axilla noted.

## 2018-08-30 ENCOUNTER — Emergency Department
Admission: EM | Admit: 2018-08-30 | Discharge: 2018-08-30 | Disposition: A | Discharge status: Home / Self Care | Payer: Self-pay | Attending: Emergency Medicine | Admitting: Emergency Medicine

## 2018-08-30 ENCOUNTER — Other Ambulatory Visit: Payer: Self-pay

## 2018-08-30 ENCOUNTER — Emergency Department: Payer: Self-pay

## 2018-08-30 ENCOUNTER — Encounter: Payer: Self-pay | Admitting: Emergency Medicine

## 2018-08-30 DIAGNOSIS — J40 Bronchitis, not specified as acute or chronic: Secondary | ICD-10-CM

## 2018-08-30 DIAGNOSIS — F17210 Nicotine dependence, cigarettes, uncomplicated: Secondary | ICD-10-CM | POA: Insufficient documentation

## 2018-08-30 DIAGNOSIS — J209 Acute bronchitis, unspecified: Secondary | ICD-10-CM | POA: Insufficient documentation

## 2018-08-30 DIAGNOSIS — Z79899 Other long term (current) drug therapy: Secondary | ICD-10-CM | POA: Insufficient documentation

## 2018-08-30 DIAGNOSIS — Z720 Tobacco use: Secondary | ICD-10-CM

## 2018-08-30 LAB — BASIC METABOLIC PANEL
Anion gap: 6 (ref 5–15)
BUN: 12 mg/dL (ref 6–20)
CHLORIDE: 105 mmol/L (ref 98–111)
CO2: 27 mmol/L (ref 22–32)
CREATININE: 0.91 mg/dL (ref 0.61–1.24)
Calcium: 9.3 mg/dL (ref 8.9–10.3)
GFR calc Af Amer: >60 mL/min (ref >60)
GFR calc non Af Amer: >60 mL/min (ref >60)
Glucose, Bld: 97 mg/dL (ref 70–99)
Potassium: 4 mmol/L (ref 3.5–5.1)
SODIUM: 138 mmol/L (ref 135–145)

## 2018-08-30 LAB — CBC
HEMATOCRIT: 45.1 % (ref 39.0–52.0)
Hemoglobin: 14.9 g/dL (ref 13.0–17.0)
MCH: 28.1 pg (ref 26.0–34.0)
MCHC: 33 g/dL (ref 30.0–36.0)
MCV: 84.9 fL (ref 80.0–100.0)
Platelets: 260 10*3/uL (ref 150–400)
RBC: 5.31 MIL/uL (ref 4.22–5.81)
RDW: 12.9 % (ref 11.5–15.5)
WBC: 6.8 10*3/uL (ref 4.0–10.5)
nRBC: 0 % (ref 0.0–0.2)

## 2018-08-30 LAB — TROPONIN I: Troponin I: <0.03 ng/mL (ref <0.03)

## 2018-08-30 MED ORDER — ALBUTEROL SULFATE HFA 108 (90 BASE) MCG/ACT IN AERS: 2.0000 | INHALATION_SPRAY | Freq: Four times a day (QID) | RESPIRATORY_TRACT | 2 refills | Status: DC | PRN

## 2018-08-30 MED ORDER — ALBUTEROL SULFATE (2.5 MG/3ML) 0.083% IN NEBU
5.0000 mg | INHALATION_SOLUTION | Freq: Once | RESPIRATORY_TRACT | Status: AC
  Administered 2018-08-30: 5 mg via RESPIRATORY_TRACT
  Filled 2018-08-30: qty 6

## 2018-08-30 NOTE — ED Notes (Signed)
Pt verbalizes understanding of discharge instructions.

## 2018-08-30 NOTE — ED Notes (Signed)
Pt has c/o SOB for a few days. Denies cough or fever. VSS, pt speaking in full sentences without difficulty.

## 2018-08-30 NOTE — ED Provider Notes (Signed)
[facility] Emergency Department Provider Note  ____________________________________________   I have reviewed the triage vital signs and the nursing notes. Where available I have reviewed prior notes and, if possible and indicated, outside hospital notes.    HISTORY  Chief Complaint Shortness of Breath    HPI Tyler Horton is a 27 y.o. male  Presents today to again bleeding of rhinorrhea, and "congestion" in his chest and shortness of breath.  He has no personal family history of PE or DVT no recent travel takes no estrogens, no history of cancer or recent surgery, no leg swelling, he states that he has been feeling a little bit wheezy for the last few days.  He does smoke at least a half of a pack of cigarettes a day and has for the last 9 years.  He has had symptoms like this before when he gets colds.  He denies any fever, no nausea no vomiting no chest pain.  Denies any significant exertional symptoms.   Past Medical History:  Diagnosis Date  . Allergic rhinitis   . Knee injury    left  . Otitis media    left  . Pilonidal cyst without infection     There are no active problems to display for this patient.   Past Surgical History:  Procedure Laterality Date  . KNEE SURGERY    . WRIST SURGERY      Prior to Admission medications   Medication Sig Start Date End Date Taking? Authorizing Provider  HYDROcodone-acetaminophen (NORCO/VICODIN) 5-325 MG tablet Take 1 tablet by mouth every 6 (six) hours as needed for moderate pain. 02/25/18   Tommi Rumps, PA-C  sulfamethoxazole-trimethoprim (BACTRIM DS,SEPTRA DS) 800-160 MG tablet Take 1 tablet by mouth 2 (two) times daily. 02/25/18   Tommi Rumps, PA-C    Allergies Patient has no known allergies.  No family history on file.  Social History Social History   Tobacco Use  . Smoking status: Current Every Day Smoker    Packs/day: 0.50    Types: Cigarettes  . Smokeless tobacco: Never Used   Substance Use Topics  . Alcohol use: Yes    Alcohol/week: 12.0 standard drinks    Types: 12 Cans of beer per week  . Drug use: No    Types: Marijuana    Comment: past    Review of Systems Constitutional: No fever/chills Eyes: No visual changes. ENT: No sore throat. No stiff neck no neck pain positive rhinorrhea Cardiovascular: Denies chest pain. Respiratory: + shortness of breath. Gastrointestinal:   no vomiting.  No diarrhea.  No constipation. Genitourinary: Negative for dysuria. Musculoskeletal: Negative lower extremity swelling Skin: Negative for rash. Neurological: Negative for severe headaches, focal weakness or numbness.   ____________________________________________   PHYSICAL EXAM:  VITAL SIGNS: ED Triage Vitals  Enc Vitals Group     BP 08/30/18 1817 120/75     Pulse Rate 08/30/18 1817 82     Resp 08/30/18 1817 16     Temp 08/30/18 1817 98.2 F (36.8 C)     Temp Source 08/30/18 1817 Oral     SpO2 08/30/18 1817 98 %     Weight 08/30/18 1820 147 lb (66.7 kg)     Height 08/30/18 1820 5\' 9"  (1.753 m)     Head Circumference --      Peak Flow --      Pain Score 08/30/18 1820 0     Pain Loc --      Pain Edu? --  Excl. in GC? --     Constitutional: Alert and oriented. Well appearing and in no acute distress. Eyes: Conjunctivae are normal Head: Atraumatic HEENT: + Mild congestion/rhinnorhea. Mucous membranes are moist.  Oropharynx non-erythematous Neck:   Nontender with no meningismus, no masses, no stridor Cardiovascular: Normal rate, regular rhythm. Grossly normal heart sounds.  Good peripheral circulation. Respiratory: Normal respiratory effort.  No retractions. Lungs very slight wheezes appreciated diffusely good air movement Abdominal: Soft and nontender. No distention. No guarding no rebound Back:  There is no focal tenderness or step off.  there is no midline tenderness there are no lesions noted. there is no CVA tenderness  Musculoskeletal: No  lower extremity tenderness, no upper extremity tenderness. No joint effusions, no DVT signs strong distal pulses no edema Neurologic:  Normal speech and language. No gross focal neurologic deficits are appreciated.  Skin:  Skin is warm, dry and intact. No rash noted. Psychiatric: Mood and affect are normal. Speech and behavior are normal.  ____________________________________________   LABS (all labs ordered are listed, but only abnormal results are displayed)  Labs Reviewed  BASIC METABOLIC PANEL  CBC  TROPONIN I    Pertinent labs  results that were available during my care of the patient were reviewed by me and considered in my medical decision making (see chart for details). ____________________________________________  EKG  I personally interpreted any EKGs ordered by me or triage Normal sinus rhythm rate 76 bpm, no acute ST elevation or depression, repolarization abnormality noted.  Seen on prior EKG. ____________________________________________  RADIOLOGY  Pertinent labs & imaging results that were available during my care of the patient were reviewed by me and considered in my medical decision making (see chart for details). If possible, patient and/or family made aware of any abnormal findings.  Dg Chest 2 View  Result Date: 08/30/2018 CLINICAL DATA:  Patient with shortness of breath EXAM: CHEST - 2 VIEW COMPARISON:  Chest radiograph 04/10/2016. FINDINGS: Normal cardiac and mediastinal contours. No consolidative pulmonary opacities. No pleural effusion or pneumothorax. Regional skeleton is unremarkable. IMPRESSION: No acute cardiopulmonary process. Electronically Signed   By: Annia Belt M.D.   On: 08/30/2018 18:56   ____________________________________________    PROCEDURES  Procedure(s) performed: None  Procedures  Critical Care performed: None  ____________________________________________   INITIAL IMPRESSION / ASSESSMENT AND PLAN / ED COURSE  Pertinent  labs & imaging results that were available during my care of the patient were reviewed by me and considered in my medical decision making (see chart for details).  After albuterol, patient is feeling much better his lungs are clear and he is moving air better.  I think this is reactive, exacerbated by tobacco abuse.  I did talk to the patient for 7 minutes about tobacco cessation.  Patient is has no indication for further work-up for PE.  He is PERC negative with clear other etiology and no risk factors.  However, return precautions and follow-up of been given and understood.  At this time, there does not appear to be clinical evidence to support the diagnosis of pulmonary embolus, dissection, myocarditis, endocarditis, pericarditis, pericardial tamponade, acute coronary syndrome, pneumothorax, pneumonia, or any other acute intrathoracic pathology that will require admission or acute intervention. Nor is there evidence of any significant intra-abdominal pathology and I will send him home with albuterol, and return precautions.  Rt.   ____________________________________________   FINAL CLINICAL IMPRESSION(S) / ED DIAGNOSES  Final diagnoses:  None      This chart was dictated  using voice recognition software.  Despite best efforts to proofread,  errors can occur which can change meaning.      Jeanmarie Plant, MD 08/30/18 2002

## 2018-08-30 NOTE — ED Triage Notes (Signed)
Pt to ED via POV with c/o SOB xfew days. Denies cough or fever. NAD noted, VSS, pt speaking in full sentences.

## 2019-03-10 ENCOUNTER — Other Ambulatory Visit: Payer: Self-pay

## 2019-03-10 ENCOUNTER — Encounter: Payer: Self-pay | Admitting: Emergency Medicine

## 2019-03-10 DIAGNOSIS — L0501 Pilonidal cyst with abscess: Secondary | ICD-10-CM | POA: Insufficient documentation

## 2019-03-10 DIAGNOSIS — F17210 Nicotine dependence, cigarettes, uncomplicated: Secondary | ICD-10-CM | POA: Insufficient documentation

## 2019-03-10 DIAGNOSIS — Z79899 Other long term (current) drug therapy: Secondary | ICD-10-CM | POA: Insufficient documentation

## 2019-03-10 NOTE — ED Triage Notes (Signed)
Patient ambulatory to triage with steady gait, without difficulty or distress noted; pt reports abscess since last Thursday to buttock with hx of same

## 2019-03-11 ENCOUNTER — Emergency Department
Admission: EM | Admit: 2019-03-11 | Discharge: 2019-03-11 | Disposition: A | Discharge status: Home / Self Care | Payer: Self-pay | Attending: Emergency Medicine | Admitting: Emergency Medicine

## 2019-03-11 DIAGNOSIS — L0501 Pilonidal cyst with abscess: Secondary | ICD-10-CM

## 2019-03-11 MED ORDER — SULFAMETHOXAZOLE-TRIMETHOPRIM 800-160 MG PO TABS
1.0000 | ORAL_TABLET | Freq: Once | ORAL | Status: AC
  Administered 2019-03-11: 01:00:00 1 via ORAL
  Filled 2019-03-11: qty 1

## 2019-03-11 MED ORDER — SULFAMETHOXAZOLE-TRIMETHOPRIM 800-160 MG PO TABS: 1.0000 | ORAL_TABLET | Freq: Two times a day (BID) | ORAL | 0 refills | Status: AC

## 2019-03-11 MED ORDER — OXYCODONE-ACETAMINOPHEN 5-325 MG PO TABS
1.0000 | ORAL_TABLET | Freq: Once | ORAL | Status: AC
Start: 2019-03-11 — End: 2019-03-11
  Administered 2019-03-11: 01:00:00 1 via ORAL
  Filled 2019-03-11: qty 1

## 2019-03-11 MED ORDER — LIDOCAINE HCL (PF) 1 % IJ SOLN
5.0000 mL | Freq: Once | INTRAMUSCULAR | Status: AC
  Administered 2019-03-11: 02:00:00 5 mL via INTRADERMAL
  Filled 2019-03-11: qty 5

## 2019-03-11 MED ORDER — OXYCODONE-ACETAMINOPHEN 5-325 MG PO TABS: 1.0000 | ORAL_TABLET | ORAL | 0 refills | Status: DC | PRN

## 2019-03-11 NOTE — ED Provider Notes (Signed)
[facility] Emergency Department Provider Note   First MD Initiated Contact with Patient 03/11/19 0030     (approximate)  I have reviewed the triage vital signs and the nursing notes.   HISTORY  Chief Complaint Abscess   HPI Tyler Horton is a 28 y.o. male with medical history as listed below including previous pilonidal abscess presents to the emergency department for 5-day history of possible abscess "top of buttocks".  Patient admits to increasing pain swelling in the area.  Patient denies any fever..        Past Medical History:  Diagnosis Date  . Allergic rhinitis   . Knee injury    left  . Otitis media    left  . Pilonidal cyst without infection     There are no active problems to display for this patient.   Past Surgical History:  Procedure Laterality Date  . KNEE SURGERY    . WRIST SURGERY      Prior to Admission medications   Medication Sig Start Date End Date Taking? Authorizing Provider  albuterol (PROVENTIL HFA;VENTOLIN HFA) 108 (90 Base) MCG/ACT inhaler Inhale 2 puffs into the lungs every 6 (six) hours as needed for wheezing or shortness of breath. 08/30/18   Jeanmarie PlantMcShane, James A, MD  HYDROcodone-acetaminophen (NORCO/VICODIN) 5-325 MG tablet Take 1 tablet by mouth every 6 (six) hours as needed for moderate pain. 02/25/18   Tommi RumpsSummers, Rhonda L, PA-C  sulfamethoxazole-trimethoprim (BACTRIM DS,SEPTRA DS) 800-160 MG tablet Take 1 tablet by mouth 2 (two) times daily. 02/25/18   Tommi RumpsSummers, Rhonda L, PA-C    Allergies Patient has no known allergies.  No family history on file.  Social History Social History   Tobacco Use  . Smoking status: Current Every Day Smoker    Packs/day: 0.50    Types: Cigarettes  . Smokeless tobacco: Never Used  Substance Use Topics  . Alcohol use: Yes    Alcohol/week: 12.0 standard drinks    Types: 12 Cans of beer per week  . Drug use: No    Types: Marijuana    Comment: past    Review of Systems  Constitutional: No fever/chills Eyes: No visual changes. ENT: No sore throat. Cardiovascular: Denies chest pain. Respiratory: Denies shortness of breath. Gastrointestinal: No abdominal pain.  No nausea, no vomiting.  No diarrhea.  No constipation. Genitourinary: Negative for dysuria. Musculoskeletal: Negative for neck pain.  Negative for back pain. Integumentary: Negative for rash.  Positive for possible abscess Neurological: Negative for headaches, focal weakness or numbness.  ____________________________________________   PHYSICAL EXAM:  VITAL SIGNS: ED Triage Vitals  Enc Vitals Group     BP 03/10/19 2358 (!) 124/100     Pulse Rate 03/10/19 2358 95     Resp 03/10/19 2358 18     Temp 03/10/19 2358 98.3 F (36.8 C)     Temp Source 03/10/19 2358 Oral     SpO2 03/10/19 2358 97 %     Weight 03/10/19 2358 68 kg (150 lb)     Height 03/10/19 2358 1.753 m (5\' 9" )     Head Circumference --      Peak Flow --      Pain Score 03/10/19 2357 10     Pain Loc --      Pain Edu? --      Excl. in [place]? --     Constitutional: Alert and oriented. Well appearing and in no acute distress. Eyes: Conjunctivae are normal. Mouth/Throat: Mucous membranes are moist.  Oropharynx non-erythematous. Neck: No stridor.  Cardiovascular: Normal rate, regular rhythm. Good peripheral circulation. Grossly normal heart sounds. Respiratory: Normal respiratory effort.  No retractions. No audible wheezing. Gastrointestinal: Soft and nontender. No distention.  Musculoskeletal: No lower extremity tenderness nor edema. No gross deformities of extremities. Neurologic:  Normal speech and language. No gross focal neurologic deficits are appreciated.  Skin: Golf ball sized pilonidal abscess with flocculence. Psychiatric: Mood and affect are normal. Speech and behavior are normal.  __________________   PROCEDURES   Procedure(s) performed (including Critical Care):  Marland KitchenMarland KitchenIncision and Drainage Date/Time: 03/11/2019  2:37 AM Performed by: Darci Current, MD Authorized by: Darci Current, MD   Consent:    Consent obtained:  Verbal   Consent given by:  Patient   Risks discussed:  Bleeding, infection, incomplete drainage and pain   Alternatives discussed:  Alternative treatment, delayed treatment and observation Location:    Type:  Pilonidal cyst Pre-procedure details:    Skin preparation:  Betadine Anesthesia (see MAR for exact dosages):    Anesthesia method:  Local infiltration   Local anesthetic:  Lidocaine 1% w/o epi Procedure type:    Complexity:  Complex Procedure details:    Incision types:  Single straight   Scalpel blade:  11   Drainage amount:  Copious Post-procedure details:    Patient tolerance of procedure:  Tolerated well, no immediate complications     ____________________________________________   INITIAL IMPRESSION / MDM / ASSESSMENT AND PLAN / ED COURSE  As part of my medical decision making, I reviewed the following data within the electronic MEDICAL RECORD NUMBER   28 year old male presented with above-stated history and physical exam consistent with pilonidal abscess.  I&D performed with copious purulent drainage obtained.  Patient given Bactrim DS and Percocet emergency department will be prescribed same for home.  Tyler Horton was evaluated in Emergency Department on 03/11/2019 for the symptoms described in the history of present illness. He was evaluated in the context of the global COVID-19 pandemic, which necessitated consideration that the patient might be at risk for infection with the SARS-CoV-2 virus that causes COVID-19. Institutional protocols and algorithms that pertain to the evaluation of patients at risk for COVID-19 are in a state of rapid change based on information released by regulatory bodies including the CDC and federal and state organizations. These policies and algorithms were followed during the patient's care in the ED.         ____________________________________________  FINAL CLINICAL IMPRESSION(S) / ED DIAGNOSES  Final diagnoses:  Pilonidal abscess     MEDICATIONS GIVEN DURING THIS VISIT:  Medications  lidocaine (PF) (XYLOCAINE) 1 % injection 5 mL (has no administration in time range)  sulfamethoxazole-trimethoprim (BACTRIM DS) 800-160 MG per tablet 1 tablet (1 tablet Oral Given 03/11/19 0108)  oxyCODONE-acetaminophen (PERCOCET/ROXICET) 5-325 MG per tablet 1 tablet (1 tablet Oral Given 03/11/19 0108)     ED Discharge Orders    None       Note:  This document was prepared using Dragon voice recognition software and may include unintentional dictation errors.   Darci Current, MD 03/11/19 [phone]

## 2019-03-11 NOTE — ED Notes (Signed)
Lidocaine pulled and given to Dr. Manson Passey for bedside abscess drainage.

## 2019-05-05 IMAGING — CR DG CHEST 2V
1 series · 2 of 2 positions shown · non-contrast
Comparison: Chest radiograph 04/10/2016.

CLINICAL DATA: Patient with shortness of breath

EXAM:
CHEST - 2 VIEW

[Series 1: w chest pa · 0.14mm/px · 2 of 2 slices shown]
[im 1/2]
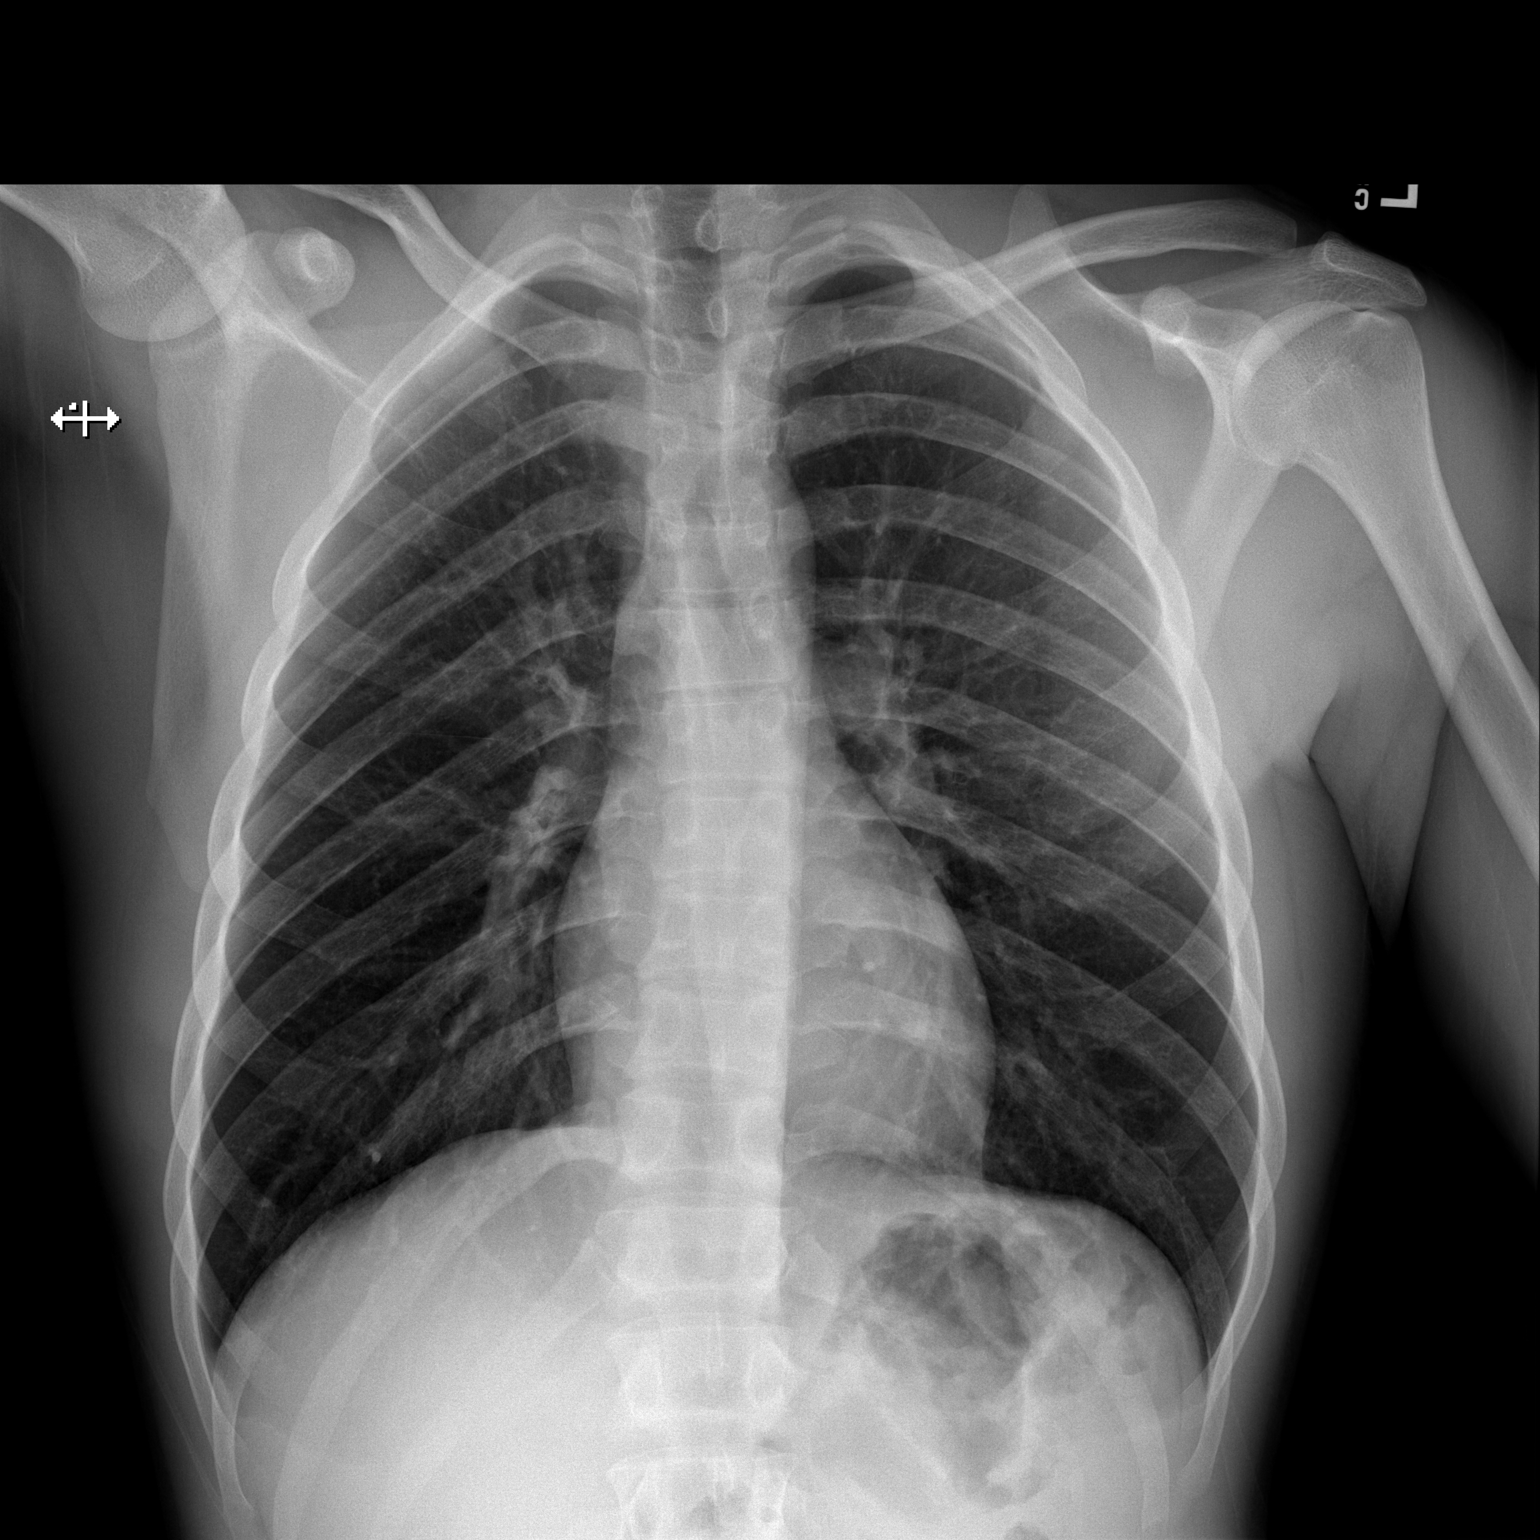
[im 2/2]
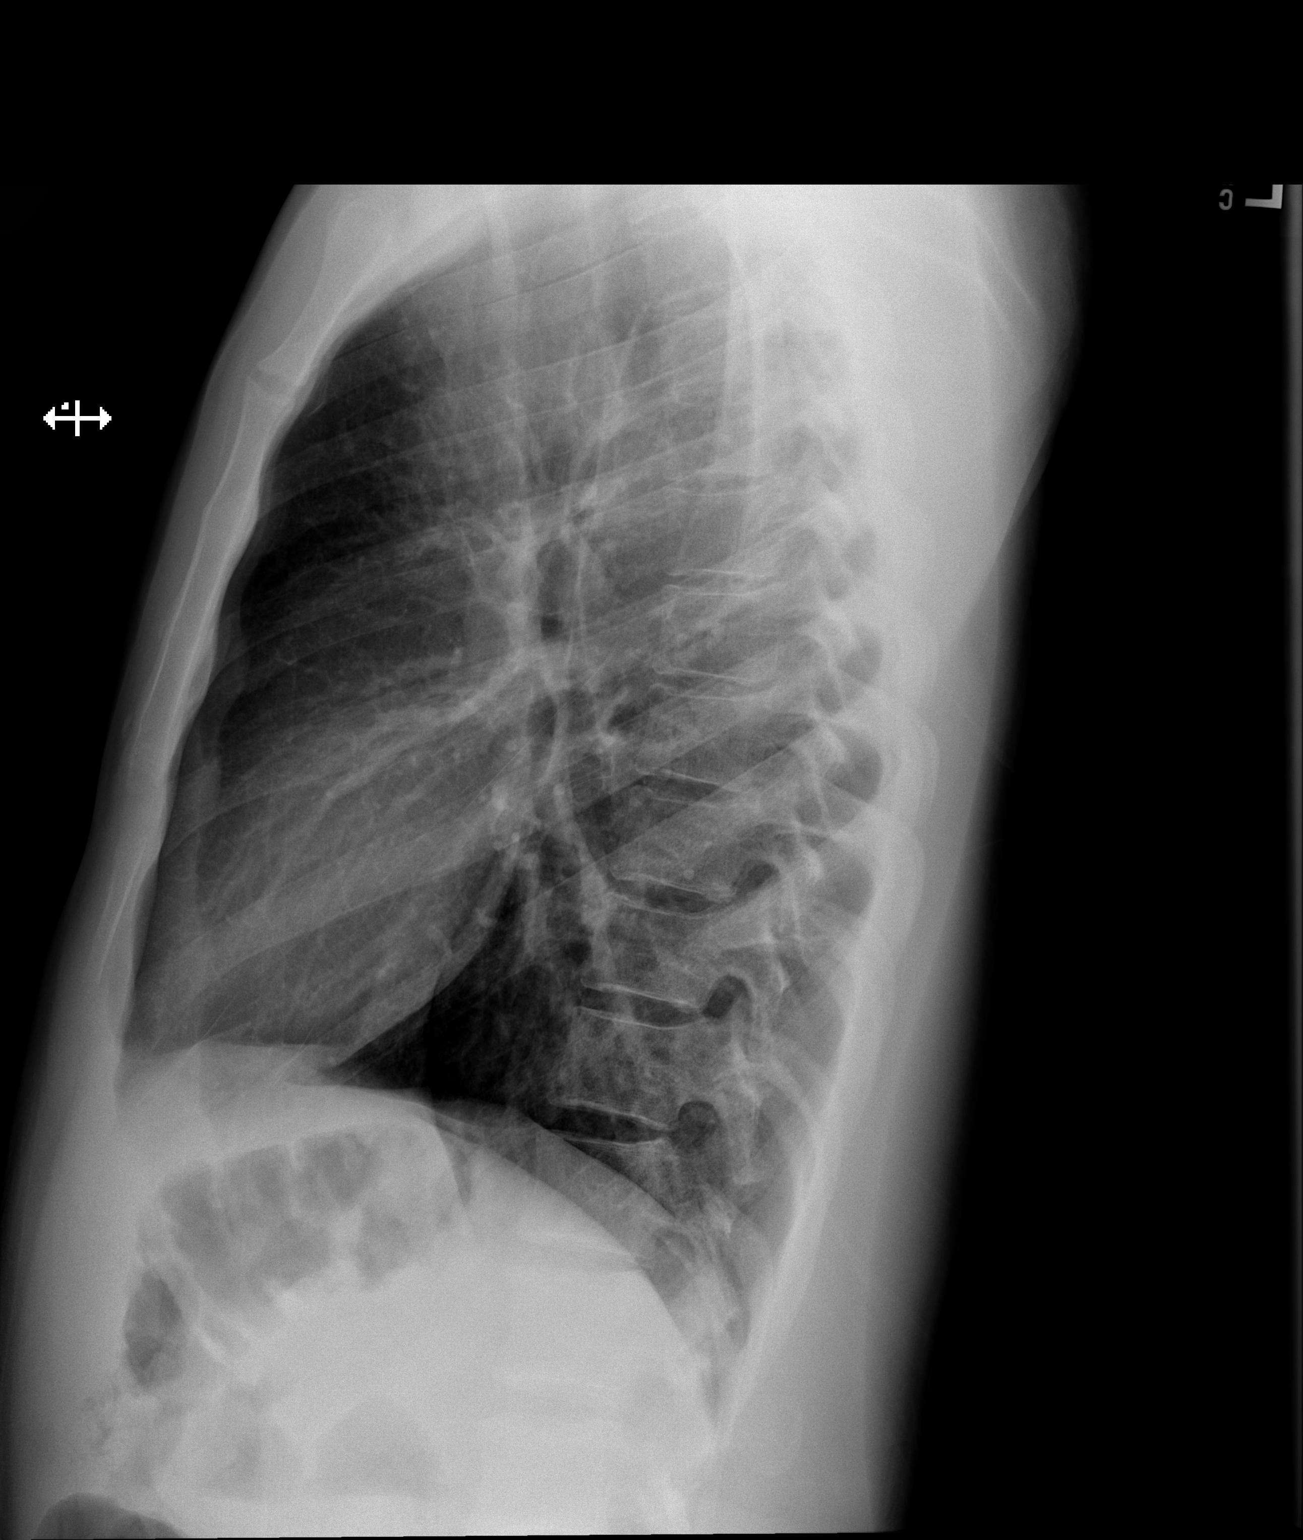

[2 of 2 positions shown; findings below may reference images not displayed]

FINDINGS: Normal cardiac and mediastinal contours. No consolidative pulmonary
opacities. No pleural effusion or pneumothorax. Regional skeleton is
unremarkable.
IMPRESSION: No acute cardiopulmonary process.

## 2019-06-20 ENCOUNTER — Other Ambulatory Visit: Payer: Self-pay | Admitting: Family Medicine

## 2019-06-20 DIAGNOSIS — Z20822 Contact with and (suspected) exposure to covid-19: Secondary | ICD-10-CM

## 2019-06-23 LAB — NOVEL CORONAVIRUS, NAA: SARS-CoV-2, NAA: NOT DETECTED

## 2019-06-25 ENCOUNTER — Telehealth: Payer: Self-pay | Admitting: Family Medicine

## 2019-06-25 NOTE — Telephone Encounter (Signed)
Patient called and received covid test results

## 2020-02-04 ENCOUNTER — Encounter: Payer: Self-pay | Admitting: *Deleted

## 2020-02-04 ENCOUNTER — Other Ambulatory Visit: Payer: Self-pay

## 2020-02-04 DIAGNOSIS — Z79899 Other long term (current) drug therapy: Secondary | ICD-10-CM | POA: Insufficient documentation

## 2020-02-04 DIAGNOSIS — L0201 Cutaneous abscess of face: Secondary | ICD-10-CM | POA: Insufficient documentation

## 2020-02-04 DIAGNOSIS — F17210 Nicotine dependence, cigarettes, uncomplicated: Secondary | ICD-10-CM | POA: Insufficient documentation

## 2020-02-04 NOTE — ED Triage Notes (Signed)
PT to ED reporting an abscess to the right side of face that opened and started to drain this evening. Pt denies jaw pain or dental pain around the area, no throat swelling noted or reported.

## 2020-02-05 ENCOUNTER — Emergency Department
Admission: EM | Admit: 2020-02-05 | Discharge: 2020-02-05 | Disposition: A | Discharge status: Home / Self Care | Payer: Self-pay | Attending: Emergency Medicine | Admitting: Emergency Medicine

## 2020-02-05 DIAGNOSIS — L0201 Cutaneous abscess of face: Secondary | ICD-10-CM

## 2020-02-05 HISTORY — DX: Anxiety disorder, unspecified: F41.9

## 2020-02-05 MED ORDER — CLINDAMYCIN HCL 300 MG PO CAPS: 300.0000 mg | ORAL_CAPSULE | Freq: Three times a day (TID) | ORAL | 0 refills | Status: DC

## 2020-02-05 MED ORDER — CLINDAMYCIN HCL 150 MG PO CAPS
300.0000 mg | ORAL_CAPSULE | Freq: Once | ORAL | Status: AC
  Administered 2020-02-05: 300 mg via ORAL
  Filled 2020-02-05: qty 2

## 2020-02-05 NOTE — ED Notes (Signed)
See triage note. Pt first noticed abscess forming approx 1 week ago. He noticed it draining for the first time this evening. Pt denies any jaw or dental pain. No fever/chills. No Hx of similar Sx. Pt A&Ox4, NAD.

## 2020-02-05 NOTE — Discharge Instructions (Addendum)
1.  Take antibiotic as prescribed (Clindamycin 300 mg 3 times daily x10 days). 2.  Apply moist heat to affected area several times a day to promote drainage. 3.  Return to the ER for worsening symptoms, persistent vomiting, difficulty breathing or other concerns.

## 2020-02-05 NOTE — ED Provider Notes (Signed)
[facility] Emergency Department Provider Note   ____________________________________________   First MD Initiated Contact with Patient 02/05/20 0120     (approximate)  I have reviewed the triage vital signs and the nursing notes.   HISTORY  Chief Complaint Abscess    HPI Tyler Horton is a 29 y.o. male who presents to the ED from home with a chief complaint of right facial abscess x1 week.  Area began to drain this evening.  Denies fever, jaw pain, dental pain, oral swelling, throat swelling, chest pain, shortness of breath, abdominal pain, nausea or vomiting.       Past Medical History:  Diagnosis Date  . Allergic rhinitis   . Anxiety   . Knee injury    left  . Otitis media    left  . Pilonidal cyst without infection     There are no problems to display for this patient.   Past Surgical History:  Procedure Laterality Date  . KNEE SURGERY    . WRIST SURGERY      Prior to Admission medications   Medication Sig Start Date End Date Taking? Authorizing Provider  albuterol (PROVENTIL HFA;VENTOLIN HFA) 108 (90 Base) MCG/ACT inhaler Inhale 2 puffs into the lungs every 6 (six) hours as needed for wheezing or shortness of breath. 08/30/18   Jeanmarie Plant, MD  HYDROcodone-acetaminophen (NORCO/VICODIN) 5-325 MG tablet Take 1 tablet by mouth every 6 (six) hours as needed for moderate pain. 02/25/18   Tommi Rumps, PA-C  oxyCODONE-acetaminophen (PERCOCET) 5-325 MG tablet Take 1 tablet by mouth every 4 (four) hours as needed for up to 10 doses. 03/11/19   Darci Current, MD  sulfamethoxazole-trimethoprim (BACTRIM DS,SEPTRA DS) 800-160 MG tablet Take 1 tablet by mouth 2 (two) times daily. 02/25/18   Tommi Rumps, PA-C    Allergies Patient has no known allergies.  History reviewed. No pertinent family history.  Social History Social History   Tobacco Use  . Smoking status: Current Every Day Smoker    Packs/day: 0.50    Types:  Cigarettes  . Smokeless tobacco: Never Used  Substance Use Topics  . Alcohol use: Yes    Alcohol/week: 12.0 standard drinks    Types: 12 Cans of beer per week  . Drug use: No    Types: Marijuana    Comment: past    Review of Systems  Constitutional: No fever/chills Eyes: No visual changes. ENT: No sore throat. Cardiovascular: Denies chest pain. Respiratory: Denies shortness of breath. Gastrointestinal: No abdominal pain.  No nausea, no vomiting.  No diarrhea.  No constipation. Genitourinary: Negative for dysuria. Musculoskeletal: Negative for back pain. Skin: Positive for right facial swelling.  Negative for rash. Neurological: Negative for headaches, focal weakness or numbness.   ____________________________________________   PHYSICAL EXAM:  VITAL SIGNS: ED Triage Vitals  Enc Vitals Group     BP 02/04/20 2314 (!) 123/55     Pulse Rate 02/04/20 2314 84     Resp 02/04/20 2314 16     Temp 02/04/20 2314 98.5 F (36.9 C)     Temp Source 02/04/20 2314 Oral     SpO2 02/04/20 2314 97 %     Weight 02/04/20 2315 150 lb (68 kg)     Height 02/04/20 2315 5\' 9"  (1.753 m)     Head Circumference --      Peak Flow --      Pain Score 02/04/20 2315 2     Pain Loc --  Pain Edu? --      Excl. in [place]? --     Constitutional: Alert and oriented. Well appearing and in no acute distress. Eyes: Conjunctivae are normal. PERRL. EOMI. No periorbital edema. Head: Atraumatic.  Small draining abscess to right cheek.  Small amount of purulence expressed. Nose: No congestion/rhinnorhea. Mouth/Throat: Mucous membranes are moist.   Neck: No stridor.  No swelling. Cardiovascular: Normal rate, regular rhythm. Grossly normal heart sounds.  Good peripheral circulation. Respiratory: Normal respiratory effort.  No retractions. Lungs CTAB. Gastrointestinal: Soft and nontender. No distention. No abdominal bruits. No CVA tenderness. Musculoskeletal: No lower extremity tenderness nor edema.  No joint  effusions. Neurologic:  Normal speech and language. No gross focal neurologic deficits are appreciated. No gait instability. Skin:  Skin is warm, dry and intact. No rash noted. Psychiatric: Mood and affect are normal. Speech and behavior are normal.  ____________________________________________   LABS (all labs ordered are listed, but only abnormal results are displayed)  Labs Reviewed - No data to display ____________________________________________  EKG  None ____________________________________________  RADIOLOGY  ED MD interpretation: None  Official radiology report(s): No results found.  ____________________________________________   PROCEDURES  Procedure(s) performed (including Critical Care):  Procedures   ____________________________________________   INITIAL IMPRESSION / ASSESSMENT AND PLAN / ED COURSE  As part of my medical decision making, I reviewed the following data within the [place] notes reviewed and incorporated, Old chart reviewed and Notes from prior ED visits     Isaih Bulger Horton was evaluated in Emergency Department on 02/05/2020 for the symptoms described in the history of present illness. He was evaluated in the context of the global COVID-19 pandemic, which necessitated consideration that the patient might be at risk for infection with the SARS-CoV-2 virus that causes COVID-19. Institutional protocols and algorithms that pertain to the evaluation of patients at risk for COVID-19 are in a state of rapid change based on information released by regulatory bodies including the CDC and federal and state organizations. These policies and algorithms were followed during the patient's care in the ED.    29 year old male presenting with small, draining right facial abscess x1 week.  Will place on Clindamycin and follow-up with ENT.  Strict return precautions given.  Patient verbalizes understanding and agrees with plan of  care.      ____________________________________________   FINAL CLINICAL IMPRESSION(S) / ED DIAGNOSES  Final diagnoses:  Facial abscess     ED Discharge Orders    None       Note:  This document was prepared using Dragon voice recognition software and may include unintentional dictation errors.   Paulette Blanch, MD 02/05/20 [phone]

## 2021-08-16 ENCOUNTER — Emergency Department
Admission: EM | Admit: 2021-08-16 | Discharge: 2021-08-16 | Disposition: A | Discharge status: Home / Self Care | Payer: Self-pay | Attending: Emergency Medicine | Admitting: Emergency Medicine

## 2021-08-16 ENCOUNTER — Other Ambulatory Visit: Payer: Self-pay

## 2021-08-16 DIAGNOSIS — L0501 Pilonidal cyst with abscess: Secondary | ICD-10-CM | POA: Insufficient documentation

## 2021-08-16 DIAGNOSIS — F17210 Nicotine dependence, cigarettes, uncomplicated: Secondary | ICD-10-CM | POA: Insufficient documentation

## 2021-08-16 LAB — URINALYSIS, ROUTINE W REFLEX MICROSCOPIC
Bilirubin Urine: NEGATIVE
Glucose, UA: NEGATIVE mg/dL
Hgb urine dipstick: NEGATIVE
Ketones, ur: NEGATIVE mg/dL
Leukocytes,Ua: NEGATIVE
Nitrite: NEGATIVE
Protein, ur: NEGATIVE mg/dL
Specific Gravity, Urine: 1.018 (ref 1.005–1.030)
pH: 5 (ref 5.0–8.0)

## 2021-08-16 MED ORDER — LIDOCAINE HCL (PF) 1 % IJ SOLN
30.0000 mL | Freq: Once | INTRAMUSCULAR | Status: AC
  Administered 2021-08-16: 10 mL via INTRADERMAL
  Filled 2021-08-16: qty 30

## 2021-08-16 MED ORDER — DOXYCYCLINE HYCLATE 100 MG PO CAPS: 100.0000 mg | ORAL_CAPSULE | Freq: Two times a day (BID) | ORAL | 0 refills | Status: AC

## 2021-08-16 NOTE — ED Provider Notes (Signed)
[facility] Emergency Department Provider Note  ____________________________________________   Event Date/Time   First MD Initiated Contact with Patient 08/16/21 1605     (approximate)  I have reviewed the triage vital signs and the nursing notes.   HISTORY  Chief Complaint Cyst   HPI Tyler Horton is a 30 y.o. male with a past medical history of anxiety and previous pilonidal cyst who presents for assessment approximate 1 week of some swelling and pain in the back of the top of his superior gluteal cleft he states that same spot he had a pilonidal cyst in the past.  Does not recall any injuries or falls.  States he feels little sore in this area around the groin does not have any diarrhea, pain with defecation, burning with urination, other areas of redness swelling or other skin changes.  She is also been a little sore in both sides of his groin but no fevers, chills, cough, nausea, vomiting, diarrhea, dysuria, rash or other acute sick symptoms at this time.  No other acute concerns.         Past Medical History:  Diagnosis Date   Allergic rhinitis    Anxiety    Knee injury    left   Otitis media    left   Pilonidal cyst without infection     There are no problems to display for this patient.   Past Surgical History:  Procedure Laterality Date   KNEE SURGERY     WRIST SURGERY      Prior to Admission medications   Medication Sig Start Date End Date Taking? Authorizing Provider  doxycycline (VIBRAMYCIN) 100 MG capsule Take 1 capsule (100 mg total) by mouth 2 (two) times daily for 10 days. 08/16/21 08/26/21 Yes Gilles Chiquito, MD  albuterol (PROVENTIL HFA;VENTOLIN HFA) 108 (90 Base) MCG/ACT inhaler Inhale 2 puffs into the lungs every 6 (six) hours as needed for wheezing or shortness of breath. 08/30/18   Jeanmarie Plant, MD    Allergies Patient has no known allergies.  No family history on file.  Social History Social History    Tobacco Use   Smoking status: Every Day    Packs/day: 0.50    Types: Cigarettes   Smokeless tobacco: Never  Substance Use Topics   Alcohol use: Yes    Alcohol/week: 12.0 standard drinks    Types: 12 Cans of beer per week   Drug use: No    Types: Marijuana    Comment: past    Review of Systems  Review of Systems  Constitutional:  Negative for chills and fever.  HENT:  Negative for sore throat.   Eyes:  Negative for pain.  Respiratory:  Negative for cough and stridor.   Cardiovascular:  Negative for chest pain.  Gastrointestinal:  Negative for vomiting.  Genitourinary:  Negative for dysuria.  Musculoskeletal:  Positive for myalgias (between gluteal muscles).  Skin:  Negative for rash.  Neurological:  Negative for seizures, loss of consciousness and headaches.  Psychiatric/Behavioral:  Negative for suicidal ideas.   All other systems reviewed and are negative.    ____________________________________________   PHYSICAL EXAM:  VITAL SIGNS: ED Triage Vitals  Enc Vitals Group     BP 08/16/21 1415 97/61     Pulse Rate 08/16/21 1415 76     Resp 08/16/21 1415 18     Temp 08/16/21 1415 98.3 F (36.8 C)     Temp Source 08/16/21 1415 Oral  SpO2 08/16/21 1415 99 %     Weight 08/16/21 1440 150 lb (68 kg)     Height 08/16/21 1440 5\' 9"  (1.753 m)     Head Circumference --      Peak Flow --      Pain Score 08/16/21 1440 10     Pain Loc --      Pain Edu? --      Excl. in GC? --    Vitals:   08/16/21 1415 08/16/21 1611  BP: 97/61 108/69  Pulse: 76 91  Resp: 18 18  Temp: 98.3 F (36.8 C)   SpO2: 99% 100%   Physical Exam Vitals and nursing note reviewed.  Constitutional:      Appearance: He is well-developed.  HENT:     Head: Normocephalic and atraumatic.     Right Ear: External ear normal.     Left Ear: External ear normal.     Nose: Nose normal.     Mouth/Throat:     Mouth: Mucous membranes are moist.  Eyes:     Conjunctiva/sclera: Conjunctivae normal.   Cardiovascular:     Rate and Rhythm: Normal rate and regular rhythm.     Heart sounds: No murmur heard. Pulmonary:     Effort: Pulmonary effort is normal.  Abdominal:     General: There is no distension.     Palpations: Abdomen is soft.  Musculoskeletal:     Cervical back: Neck supple.     Right lower leg: No edema.     Left lower leg: No edema.  Skin:    General: Skin is warm and dry.     Capillary Refill: Capillary refill takes less than 2 seconds.  Neurological:     Mental Status: He is alert and oriented to person, place, and time.  Psychiatric:        Mood and Affect: Mood normal.    Approximately 1 cm palpable fluctuant circular abscess at the superior gluteal space.  No surrounding induration, edema Active drainage or bleeding at this time.  There is no inguinal hernia and penis and scrotum is unremarkable. ____________________________________________   LABS (all labs ordered are listed, but only abnormal results are displayed)  Labs Reviewed  URINALYSIS, ROUTINE W REFLEX MICROSCOPIC - Abnormal; Notable for the following components:      Result Value   Color, Urine YELLOW (*)    APPearance CLEAR (*)    All other components within normal limits   ____________________________________________  EKG  ____________________________________________  RADIOLOGY  ED MD interpretation:    Official radiology report(s): No results found.  ____________________________________________   PROCEDURES  Procedure(s) performed (including Critical Care):  09/29/22Marland KitchenIncision and Drainage  Date/Time: 08/16/2021 4:22 PM Performed by: 08/18/2021, MD Authorized by: Gilles Chiquito, MD   Consent:    Consent obtained:  Verbal   Consent given by:  Patient   Risks discussed:  Bleeding, damage to other organs, incomplete drainage and infection   Alternatives discussed:  No treatment Universal protocol:    Procedure explained and questions answered to patient or proxy's  satisfaction: yes     Patient identity confirmed:  Verbally with patient Location:    Type:  Abscess   Size:  1cm   Location:  Anogenital   Anogenital location:  Pilonidal Pre-procedure details:    Procedure prep: alcohol prep pads. Sedation:    Sedation type:  None Anesthesia:    Anesthesia method:  Local infiltration   Local anesthetic:  Lidocaine 1% w/o  epi Procedure type:    Complexity:  Simple Procedure details:    Ultrasound guidance: no     Needle aspiration: no     Incision types:  Stab incision   Incision depth:  Dermal   Wound management:  Probed and deloculated   Drainage:  Purulent   Drainage amount:  Moderate   Wound treatment:  Wound left open   Packing materials:  None Post-procedure details:    Procedure completion:  Tolerated well, no immediate complications   ____________________________________________   INITIAL IMPRESSION / ASSESSMENT AND PLAN / ED COURSE     Patient presents with a history exam for assessment of some pain redness and swelling in his gluteal cleft he states that is the same spot he previously had a pilonidal cyst.  He is also had some soreness in his groin on both sides but no other associated sick symptoms.  On arrival he is afebrile hemodynamically stable.  His penis scrotum and groin is unremarkable.  He does have pilonidal abscess palpable on exam.  External rectal exam is unremarkable.  UA obtained in triage is unremarkable.  Unclear urology of the source and groin although there is no evidence of hernia he denies any urinary symptoms or discharge to suggest cystitis at this time.  Hospitalist experiencing some lymphadenopathy only mild to palpate significant inguinal lymph nodes at this time.  Pilonidal abscess I&D as above.  Will cover with course of doxycycline.  Unclear as etiology of his soreness in his groin although I think is stable for discharge with close outpatient PCP follow-up.  Discharged stable condition.  Strict return  precautions advised and discussed.      ____________________________________________   FINAL CLINICAL IMPRESSION(S) / ED DIAGNOSES  Final diagnoses:  Pilonidal abscess    Medications  lidocaine (PF) (XYLOCAINE) 1 % injection 30 mL (10 mLs Intradermal Given 08/16/21 1613)     ED Discharge Orders          Ordered    doxycycline (VIBRAMYCIN) 100 MG capsule  2 times daily        08/16/21 1620             Note:  This document was prepared using Dragon voice recognition software and may include unintentional dictation errors.    Gilles Chiquito, MD 08/16/21 [phone]

## 2021-08-16 NOTE — ED Triage Notes (Signed)
Pt here with a cyst in between his buttocks. Pt also c/o tightness in his groin. Pt denies N/V/D.

## 2021-08-16 NOTE — ED Provider Notes (Signed)
Emergency Medicine Provider Triage Evaluation Note  Tyler Horton, a 30 y.o. male  was evaluated in triage.  Pt complains of a pilonidal cyst abscess since last week. He had a similar episode a few years ago. He also endorses some bilateral inner thigh muscle pain. He denies dysuria, hematuria, or penile discharge.  Review of Systems  Positive: Pilonidal cyst abscess Negative: FCS  Physical Exam  BP 97/61 (BP Location: Left Arm)   Pulse 76   Temp 98.3 F (36.8 C) (Oral)   Resp 18   Ht 5\' 9"  (1.753 m)   Wt 68 kg   SpO2 99%   BMI 22.15 kg/m  Gen:   Awake, no distress  NAD Resp:  Normal effort CTA MSK:   Moves extremities without difficulty  Other:  CVS: RRR  Medical Decision Making  Medically screening exam initiated at 2:46 PM.  Appropriate orders placed.  Tyler Horton was informed that the remainder of the evaluation will be completed by another provider, this initial triage assessment does not replace that evaluation, and the importance of remaining in the ED until their evaluation is complete.  Patient with ED evaluation of pilonidal cyst abscess and thigh muscle pain.    Paulo Fruit, PA-C 08/16/21 1449    08/18/21, MD 08/16/21 [phone]

## 2022-02-21 ENCOUNTER — Emergency Department
Admission: EM | Admit: 2022-02-21 | Discharge: 2022-02-21 | Disposition: A | Discharge status: Home / Self Care | Payer: Self-pay | Attending: Emergency Medicine | Admitting: Emergency Medicine

## 2022-02-21 ENCOUNTER — Other Ambulatory Visit: Payer: Self-pay

## 2022-02-21 DIAGNOSIS — L0502 Pilonidal sinus with abscess: Secondary | ICD-10-CM | POA: Insufficient documentation

## 2022-02-21 DIAGNOSIS — L0501 Pilonidal cyst with abscess: Secondary | ICD-10-CM

## 2022-02-21 MED ORDER — SULFAMETHOXAZOLE-TRIMETHOPRIM 800-160 MG PO TABS
1.0000 | ORAL_TABLET | Freq: Once | ORAL | Status: AC
  Administered 2022-02-21: 1 via ORAL
  Filled 2022-02-21: qty 1

## 2022-02-21 MED ORDER — LIDOCAINE HCL (PF) 1 % IJ SOLN
5.0000 mL | Freq: Once | INTRAMUSCULAR | Status: AC
  Administered 2022-02-21: 5 mL
  Filled 2022-02-21: qty 5

## 2022-02-21 MED ORDER — SULFAMETHOXAZOLE-TRIMETHOPRIM 800-160 MG PO TABS: 1.0000 | ORAL_TABLET | Freq: Two times a day (BID) | ORAL | 0 refills | Status: AC

## 2022-02-21 MED ORDER — HYDROCODONE-ACETAMINOPHEN 5-325 MG PO TABS: 1.0000 | ORAL_TABLET | Freq: Three times a day (TID) | ORAL | 0 refills | Status: AC | PRN

## 2022-02-21 NOTE — ED Notes (Addendum)
See triage note  presents with possible abscess area to buttocks  noticed area last Thursday  States he has been using hot showers and he had some doxy left from last time  ?

## 2022-02-21 NOTE — ED Triage Notes (Signed)
Pt comes with c/o abscess noted to mid buttocks since last Thursday.  ?

## 2022-02-21 NOTE — ED Provider Notes (Signed)
? ? ?[facility] ?Emergency Department Provider Note ? ? ? ? Event Date/Time  ? First MD Initiated Contact with Patient 02/21/22 1139   ?  (approximate) ? ? ?History  ? ?Abscess ? ? ?HPI ? ?Tyler Horton is a 31 y.o. male presents to the ED for evaluation of an abscess to the middle of his buttocks since last Thursday. He denies fevers, chills, sweats, or spontaneous purulent drainage. 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  ? ?Physical Exam  ? ?Triage Vital Signs: ?ED Triage Vitals  ?Enc Vitals Group  ?   BP 02/21/22 1057 113/75  ?   Pulse Rate 02/21/22 1057 96  ?   Resp 02/21/22 1057 17  ?   Temp 02/21/22 1057 99.6 ?F (37.6 ?C)  ?   Temp Source 02/21/22 1057 Oral  ?   SpO2 02/21/22 1057 96 %  ?   Weight 02/21/22 1141 149 lb 14.6 oz (68 kg)  ?   Height 02/21/22 1141 5\' 9"  (1.753 m)  ?   Head Circumference --   ?   Peak Flow --   ?   Pain Score 02/21/22 1056 6  ?   Pain Loc --   ?   Pain Edu? --   ?   Excl. in GC? --   ? ? ?Most recent vital signs: ?Vitals:  ? 02/21/22 1057 02/21/22 1414  ?BP: 113/75 118/76  ?Pulse: 96 88  ?Resp: 17 16  ?Temp: 99.6 ?F (37.6 ?C)   ?SpO2: 96% 97%  ? ? ?General Awake, no distress.  ?CV:  Good peripheral perfusion.  ?RESP:  Normal effort.  ?ABD:  No distention.  ?SKIN:  Patient with a area of induration to the top of the gluteal cleft consistent with a probable early pilonidal cyst formation.  No significant pointing, fluctuance, erythema, induration, or spontaneous drainage is noted.  Evidence of previous I&D procedures noted. ? ? ?ED Results / Procedures / Treatments  ? ?Labs ?(all labs ordered are listed, but only abnormal results are displayed) ?Labs Reviewed - No data to display ? ? ?EKG ? ? ? ?RADIOLOGY ? ? ?No results found. ? ? ?PROCEDURES: ? ?Critical Care performed: No ? ?04/23/22.Incision and Drainage ? ?Date/Time: 02/21/2022 1:37 PM ?Performed by: 04/23/2022, PA-C ?Authorized by: Lissa Hoard, PA-C  ? ?Consent:  ?   Consent obtained:  Verbal ?  Consent given by:  Patient ?  Risks, benefits, and alternatives were discussed: yes   ?  Risks discussed:  Bleeding, incomplete drainage and pain ?  Alternatives discussed:  Alternative treatment ?Universal protocol:  ?  Site/side marked: yes   ?  Patient identity confirmed:  Verbally with patient ?Location:  ?  Type:  Pilonidal cyst ?  Size:  2 ?Pre-procedure details:  ?  Skin preparation:  Povidone-iodine ?Sedation:  ?  Sedation type:  None ?Anesthesia:  ?  Anesthesia method:  Local infiltration ?  Local anesthetic:  Lidocaine 1% w/o epi ?Procedure type:  ?  Complexity:  Simple ?Procedure details:  ?  Ultrasound guidance: no   ?  Needle aspiration: no   ?  Incision types:  Stab incision ?  Incision depth:  Subcutaneous ?  Wound management:  Probed and deloculated ?  Drainage:  Bloody ?  Drainage amount:  Scant ?  Wound treatment:  Wound left open ?  Packing materials:  None ?Post-procedure details:  ?  Procedure completion:  Tolerated well, no immediate complications ? ? ?  MEDICATIONS ORDERED IN ED: ?Medications  ?sulfamethoxazole-trimethoprim (BACTRIM DS) 800-160 MG per tablet 1 tablet (1 tablet Oral Given 02/21/22 1248)  ?lidocaine (PF) (XYLOCAINE) 1 % injection 5 mL (5 mLs Infiltration Given by Other 02/21/22 1413)  ? ? ? ?IMPRESSION / MDM / ASSESSMENT AND PLAN / ED COURSE  ?I reviewed the triage vital signs and the nursing notes. ?             ?               ? ?Differential diagnosis includes, but is not limited to, pilonidal cyst abscess, perirectal abscess, cellulitis ? ?Patient to the ED with concern for recurrent pilonidal cyst abscess.  He is evaluated for his complaints in the ED, and consented to an I&D procedure.  Patient's I&D procedure did not result in any significant purulent drainage or abscess pocket noted.  Patient's diagnosis is consistent with bilateral cyst Bactrim. Patient will be discharged home with prescriptions for Bactrim. Patient is to follow up with general  surgery as needed or otherwise directed. Patient is given ED precautions to return to the ED for any worsening or new symptoms. ? ? ?FINAL CLINICAL IMPRESSION(S) / ED DIAGNOSES  ? ?Final diagnoses:  ?Pilonidal abscess  ? ? ? ?Rx / DC Orders  ? ?ED Discharge Orders   ? ?      Ordered  ?  sulfamethoxazole-trimethoprim (BACTRIM DS) 800-160 MG tablet  2 times daily       ? 02/21/22 1239  ?  HYDROcodone-acetaminophen (NORCO) 5-325 MG tablet  3 times daily PRN       ? 02/21/22 1239  ? ?  ?  ? ?  ? ? ? ?Note:  This document was prepared using Dragon voice recognition software and may include unintentional dictation errors. ? ?  ?Lissa Hoard, PA-C ?02/21/22 1940 ? ?  ?Chesley Noon, MD ?02/22/22 2354 ? ?

## 2022-02-21 NOTE — Discharge Instructions (Signed)
Keep the wound clean, dry, and covered. Follow-up with General Surgery for further treatment. Take all of the antibiotic pills as directed. Apply warm compresses to promote healing.  ?

## 2022-08-08 ENCOUNTER — Ambulatory Visit
Admission: EM | Admit: 2022-08-08 | Discharge: 2022-08-08 | Disposition: A | Discharge status: Home / Self Care | Payer: Self-pay | Attending: Internal Medicine | Admitting: Internal Medicine

## 2022-08-08 DIAGNOSIS — R0602 Shortness of breath: Secondary | ICD-10-CM

## 2022-08-08 DIAGNOSIS — Z720 Tobacco use: Secondary | ICD-10-CM

## 2022-08-08 MED ORDER — ALBUTEROL SULFATE HFA 108 (90 BASE) MCG/ACT IN AERS
2.0000 | INHALATION_SPRAY | Freq: Once | RESPIRATORY_TRACT | Status: AC
  Administered 2022-08-08: 2 via RESPIRATORY_TRACT

## 2022-08-08 NOTE — ED Provider Notes (Signed)
[facility]    CSN: [id number] Arrival date & time: 08/08/22  1334      History   Chief Complaint Chief Complaint  Patient presents with   Shortness of Breath    HPI Tyler Horton is a 31 y.o. male.  He presents today with breathlessness.  This has been noticeable for him for the last couple of days, after an episode where he was out drinking and smoking by his report on Friday night 9/15.  He says that he smoked a lot more than he usually does, and has felt breathless since.   Says his girlfriend made him quit smoking after this, because they are expecting.   No cough, no runny/congested nose, not having sore throat.  No fever, no GI symptoms.  Feels well otherwise.   Started a new job a couple weeks ago, was doing some lifting, but it sounds like this job change started before symptoms did.   Shortness of Breath   Past Medical History:  Diagnosis Date   Allergic rhinitis    Anxiety    Knee injury    left   Otitis media    left   Pilonidal cyst without infection     There are no problems to display for this patient.   Past Surgical History:  Procedure Laterality Date   KNEE SURGERY     WRIST SURGERY         Home Medications    Prior to Admission medications   Not on File    Family History History reviewed. No pertinent family history.  Social History Social History   Tobacco Use   Smoking status: Every Day    Packs/day: 0.50    Types: Cigarettes   Smokeless tobacco: Never  Vaping Use   Vaping Use: Never used  Substance Use Topics   Alcohol use: Yes    Alcohol/week: 12.0 standard drinks of alcohol    Types: 12 Cans of beer per week   Drug use: No    Types: Marijuana    Comment: past     Allergies   Patient has no known allergies.   Review of Systems Review of Systems  Respiratory:  Positive for shortness of breath.      Physical Exam Triage Vital Signs ED Triage Vitals  Enc Vitals Group     BP 08/08/22 1347 (!)  161/142     Pulse Rate 08/08/22 1347 78     Resp 08/08/22 1347 18     Temp --      Temp Source 08/08/22 1347 Oral     SpO2 08/08/22 1347 100 %     Weight 08/08/22 1348 150 lb (68 kg)     Height 08/08/22 1348 5\' 9"  (1.753 m)     Head Circumference --      Peak Flow --      Pain Score 08/08/22 1347 0     Pain Loc --      Pain Edu? --      Excl. in GC? --    No data found.  Updated Vital Signs BP 124/63 (BP Location: Left Arm)   Pulse 78   Resp 18   Ht 5\' 9"  (1.753 m)   Wt 68 kg   SpO2 100%   BMI 22.15 kg/m   Visual Acuity Right Eye Distance:   Left Eye Distance:   Bilateral Distance:    Right Eye Near:   Left Eye Near:    Bilateral Near:  Physical Exam Constitutional:      General: He is not in acute distress.    Appearance: He is not ill-appearing.     Comments: Good hygiene, faint smell of smoke  HENT:     Head: Atraumatic.     Comments: Bilateral TMs are translucent, no erythema Significant nasal congestion Posterior pharynx unremarkable    Mouth/Throat:     Mouth: Mucous membranes are moist.  Eyes:     Conjunctiva/sclera:     Right eye: Right conjunctiva is not injected. No exudate.    Left eye: Left conjunctiva is not injected. No exudate.    Comments: Conjugate gaze observed  Cardiovascular:     Rate and Rhythm: Normal rate and regular rhythm.  Pulmonary:     Effort: Pulmonary effort is normal. No respiratory distress.     Breath sounds: No wheezing or rhonchi.     Comments: Slightly coarse breath sounds throughout; improved after 2 puffs of albuterol Abdominal:     General: There is no distension.  Musculoskeletal:     Cervical back: Neck supple.     Right lower leg: No edema.     Left lower leg: No edema.     Comments: Walked into the urgent care independently  Skin:    General: Skin is warm and dry.     Comments: No cyanosis  Neurological:     Mental Status: He is alert.     Comments: Face symmetric, speech clear, coherent, logical       UC Treatments / Results  Labs (all labs ordered are listed, but only abnormal results are displayed) Labs Reviewed - No data to display  EKG   Radiology No results found.  Procedures Procedures (including critical care time)  Medications Ordered in UC Medications  albuterol (VENTOLIN HFA) 108 (90 Base) MCG/ACT inhaler 2 puff (2 puffs Inhalation Given 08/08/22 1430)    Initial Impression / Assessment and Plan / UC Course  Initial blood pressure measurement was elevated, however recheck was in the normal range  Final Clinical Impressions(s) / UC Diagnoses   Final diagnoses:  SOB (shortness of breath)  Tobacco abuse     Discharge Instructions      Symptoms and exam today were reassuring and without danger signs.  It is very common to feel breathless for a time after exposure to substances (like tobacco smoke or vape juice) that are very irritating to the lungs and airways.  Typically this heals without incident after exposure to the irritant is stopped.  Other possible causes of breathlessness include change in level of physical activity, less likely respiratory viral infection or stomach acid irritation (no other symptoms to suggest this).  An albuterol inhaler, 2 puffs every 4 hours as needed, was given at the urgent care and may help improve breathlessness in the short term while the airways are healing.       ED Prescriptions   None    PDMP not reviewed this encounter.

## 2022-08-08 NOTE — Discharge Instructions (Addendum)
Symptoms and exam today were reassuring and without danger signs.  It is very common to feel breathless for a time after exposure to substances (like tobacco smoke or vape juice) that are very irritating to the lungs and airways.  Typically this heals without incident after exposure to the irritant is stopped.  Other possible causes of breathlessness include change in level of physical activity, less likely respiratory viral infection or stomach acid irritation (no other symptoms to suggest this).  An albuterol inhaler, 2 puffs every 4 hours as needed, was given at the urgent care and may help improve breathlessness in the short term while the airways are healing.

## 2022-08-08 NOTE — ED Triage Notes (Signed)
Pt is with his girlfriend  Pt c/o SOB x2days.  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 Pt states that it is a new job and he started working last week.  Pt denies any chest pain, cough, congestion,  Pt describes it as having to take deep breaths constantly.   Pt states that the SOB is present all the time, even when laying down in bed.   Pt has anxiety and is unsure if he has Asthma.   Pt smokes cigarettes and was seen before for SOB.

## 2022-08-18 ENCOUNTER — Emergency Department: Payer: Self-pay

## 2022-08-18 ENCOUNTER — Emergency Department
Admission: EM | Admit: 2022-08-18 | Discharge: 2022-08-18 | Disposition: A | Discharge status: Home / Self Care | Payer: Self-pay | Attending: Emergency Medicine | Admitting: Emergency Medicine

## 2022-08-18 ENCOUNTER — Other Ambulatory Visit: Payer: Self-pay

## 2022-08-18 DIAGNOSIS — F17210 Nicotine dependence, cigarettes, uncomplicated: Secondary | ICD-10-CM | POA: Insufficient documentation

## 2022-08-18 DIAGNOSIS — R002 Palpitations: Secondary | ICD-10-CM | POA: Insufficient documentation

## 2022-08-18 DIAGNOSIS — R0602 Shortness of breath: Secondary | ICD-10-CM | POA: Insufficient documentation

## 2022-08-18 LAB — COMPREHENSIVE METABOLIC PANEL
ALT: 22 U/L (ref 0–44)
AST: 22 U/L (ref 15–41)
Albumin: 4.6 g/dL (ref 3.5–5.0)
Alkaline Phosphatase: 78 U/L (ref 38–126)
Anion gap: 6 (ref 5–15)
BUN: 13 mg/dL (ref 6–20)
CO2: 29 mmol/L (ref 22–32)
Calcium: 9.5 mg/dL (ref 8.9–10.3)
Chloride: 104 mmol/L (ref 98–111)
Creatinine, Ser: 1.01 mg/dL (ref 0.61–1.24)
GFR, Estimated: >60 mL/min (ref >60)
Glucose, Bld: 98 mg/dL (ref 70–99)
Potassium: 3.8 mmol/L (ref 3.5–5.1)
Sodium: 139 mmol/L (ref 135–145)
Total Bilirubin: 1 mg/dL (ref 0.3–1.2)
Total Protein: 8.1 g/dL (ref 6.5–8.1)

## 2022-08-18 LAB — CBC
HCT: 44.7 % (ref 39.0–52.0)
Hemoglobin: 14.7 g/dL (ref 13.0–17.0)
MCH: 27.6 pg (ref 26.0–34.0)
MCHC: 32.9 g/dL (ref 30.0–36.0)
MCV: 83.9 fL (ref 80.0–100.0)
Platelets: 262 10*3/uL (ref 150–400)
RBC: 5.33 MIL/uL (ref 4.22–5.81)
RDW: 12.7 % (ref 11.5–15.5)
WBC: 6.4 10*3/uL (ref 4.0–10.5)
nRBC: 0 % (ref 0.0–0.2)

## 2022-08-18 LAB — D-DIMER, QUANTITATIVE: D-Dimer, Quant: <0.27 ug/mL-FEU (ref 0.00–0.50)

## 2022-08-18 LAB — TROPONIN I (HIGH SENSITIVITY): Troponin I (High Sensitivity): 3 ng/L (ref <18)

## 2022-08-18 MED ORDER — HYDROXYZINE HCL 25 MG PO TABS: 25.0000 mg | ORAL_TABLET | Freq: Three times a day (TID) | ORAL | 0 refills | Status: AC | PRN

## 2022-08-18 NOTE — ED Provider Notes (Signed)
[facility] Provider Note    Event Date/Time   First MD Initiated Contact with Patient 08/18/22 1033     (approximate)  History   Chief Complaint: Shortness of Breath  HPI  Tyler Horton is a 31 y.o. male with a past medical history of anxiety, presents to the emergency department for shortness of breath.  According to the patient for the past 5 days or so he has been feeling short of breath.  He states Sunday he was at a friend's house drinking and watching football.  States he was smoking cigarettes at that time as well.  The next day states he woke up feeling short of breath and intermittently feeling like his heart is racing although not all the time but not currently.  States he saw his doctor 3 days ago who prescribed him albuterol inhaler.  He has been using the albuterol inhaler states it helps a little but states he still gets short of breath with exertion.  Patient denies any significant cough no fever.    Physical Exam   Triage Vital Signs: ED Triage Vitals  Enc Vitals Group     BP 08/18/22 1026 103/88     Pulse Rate 08/18/22 1026 78     Resp 08/18/22 1026 18     Temp 08/18/22 1026 97.8 F (36.6 C)     Temp Source 08/18/22 1026 Oral     SpO2 08/18/22 1026 96 %     Weight 08/18/22 1019 149 lb 14.6 oz (68 kg)     Height 08/18/22 1019 5\' 9"  (1.753 m)     Head Circumference --      Peak Flow --      Pain Score 08/18/22 1019 0     Pain Loc --      Pain Edu? --      Excl. in [place]? --     Most recent vital signs: Vitals:   08/18/22 1026  BP: 103/88  Pulse: 78  Resp: 18  Temp: 97.8 F (36.6 C)  SpO2: 96%    General: Awake, no distress.  CV:  Good peripheral perfusion.  Regular rate and rhythm  Resp:  Normal effort.  Equal breath sounds bilaterally.  Abd:  No distention.  Soft, nontender.  No rebound or guarding.  ED Results / Procedures / Treatments   EKG  EKG viewed and interpreted by myself shows a normal sinus rhythm at 78 bpm  with a narrow QRS, normal axis, normal intervals, no concerning ST changes.  Reassuring EKG.  RADIOLOGY  I have reviewed and interpreted the chest x-ray images.  No obvious consolidation on my evaluation. Radiology has read the chest x-ray as negative   MEDICATIONS ORDERED IN ED: Medications - No data to display   IMPRESSION / MDM / [place] / ED COURSE  I reviewed the triage vital signs and the nursing notes.  Patient's presentation is most consistent with acute presentation with potential threat to life or bodily function.  Patient presents emergency department for shortness of breath over the past 5 days.  Patient states is worse with exertion and intermittently feels like he is having palpitations or heart racing sensation.  None currently.  Patient's vital signs are reassuring in the emergency department.  EKG is reassuring.  We will check labs including cardiac enzymes and a D-dimer as a precaution.  We will obtain a chest x-ray and continue to closely monitor.  On my examination patient has no wheeze  rales or rhonchi, speaking in full sentences without any difficulty.  Patient does state a history of anxiety although states he has never had symptoms lasting this [place] with anxiety in the past.  Lab work is reassuring with a normal CBC normal chemistry negative D-dimer normal troponin.  Chest x-ray is clear.  Overall the patient appears well.  Does state a history of anxiety.  Patient has clear lung sounds he is already prescribed albuterol.  We will prescribe hydroxyzine to see if this helps with patient's symptoms as well.  Patient to follow-up with his primary care doctor.  FINAL CLINICAL IMPRESSION(S) / ED DIAGNOSES   Dyspnea   Note:  This document was prepared using Dragon voice recognition software and may include unintentional dictation errors.   Minna Antis, MD 08/18/22 1325

## 2022-08-18 NOTE — ED Triage Notes (Signed)
Pt here with SOB and tachycardia. Pt states he feels tachycardic intermittently if he gets anxious. Pt denies cp, N/V/D.

## 2024-08-27 ENCOUNTER — Ambulatory Visit: Admission: EM | Admit: 2024-08-27 | Discharge: 2024-08-27 | Discharge status: Against Medical Advice | Payer: Self-pay

## 2024-08-27 NOTE — ED Notes (Signed)
 Called pt from lobby x1 no ans. Called # on file & pt stated he left.

## 2024-10-24 ENCOUNTER — Ambulatory Visit: Payer: Self-pay

## 2024-10-27 ENCOUNTER — Ambulatory Visit: Payer: Self-pay
